# Patient Record
Sex: Female | Born: 1940 | Race: White | Hispanic: No | Marital: Single | Smoking: Never smoker
Health system: Southern US, Community
[De-identification: ages and names within clinical notes are randomized; demographics above are authoritative.]

## PROBLEM LIST (undated history)

## (undated) DIAGNOSIS — J018 Other acute sinusitis: Secondary | ICD-10-CM

## (undated) DIAGNOSIS — J3489 Other specified disorders of nose and nasal sinuses: Secondary | ICD-10-CM

## (undated) DIAGNOSIS — E7800 Pure hypercholesterolemia, unspecified: Secondary | ICD-10-CM

## (undated) DIAGNOSIS — J45909 Unspecified asthma, uncomplicated: Secondary | ICD-10-CM

## (undated) HISTORY — PX: TONSILLECTOMY: SUR1361

## (undated) HISTORY — DX: Pure hypercholesterolemia, unspecified: E78.00

## (undated) HISTORY — PX: ROTATOR CUFF REPAIR: SHX139

## (undated) HISTORY — PX: PNEUMONECTOMY: SHX168

## (undated) HISTORY — DX: Unspecified asthma, uncomplicated: J45.909

## (undated) HISTORY — PX: CORNEAL TRANSPLANT: SHX108

## (undated) HISTORY — DX: Other specified disorders of nose and nasal sinuses: J34.89

## (undated) HISTORY — DX: Other acute sinusitis: J01.80

---

## 2000-01-28 ENCOUNTER — Encounter: Payer: Self-pay | Admitting: Obstetrics and Gynecology

## 2000-01-28 ENCOUNTER — Encounter: Admission: RE | Admit: 2000-01-28 | Discharge: 2000-01-28 | Payer: Self-pay | Admitting: Obstetrics and Gynecology

## 2000-08-12 ENCOUNTER — Encounter: Admission: RE | Admit: 2000-08-12 | Discharge: 2000-08-12 | Payer: Self-pay | Admitting: Family Medicine

## 2000-08-12 ENCOUNTER — Encounter: Payer: Self-pay | Admitting: Family Medicine

## 2001-03-22 ENCOUNTER — Observation Stay (HOSPITAL_COMMUNITY): Admission: EM | Admit: 2001-03-22 | Discharge: 2001-03-23 | Payer: Self-pay | Admitting: Emergency Medicine

## 2001-03-22 ENCOUNTER — Encounter: Payer: Self-pay | Admitting: Emergency Medicine

## 2001-04-05 ENCOUNTER — Encounter: Payer: Self-pay | Admitting: Family Medicine

## 2001-04-05 ENCOUNTER — Encounter: Admission: RE | Admit: 2001-04-05 | Discharge: 2001-04-05 | Payer: Self-pay | Admitting: Family Medicine

## 2001-12-23 ENCOUNTER — Encounter: Payer: Self-pay | Admitting: Obstetrics and Gynecology

## 2001-12-23 ENCOUNTER — Encounter: Admission: RE | Admit: 2001-12-23 | Discharge: 2001-12-23 | Payer: Self-pay | Admitting: Obstetrics and Gynecology

## 2003-02-01 ENCOUNTER — Encounter: Payer: Self-pay | Admitting: Family Medicine

## 2003-02-01 ENCOUNTER — Encounter: Admission: RE | Admit: 2003-02-01 | Discharge: 2003-02-01 | Payer: Self-pay | Admitting: Family Medicine

## 2005-01-09 ENCOUNTER — Encounter: Admission: RE | Admit: 2005-01-09 | Discharge: 2005-01-09 | Payer: Self-pay | Admitting: Obstetrics and Gynecology

## 2006-01-14 ENCOUNTER — Ambulatory Visit (HOSPITAL_COMMUNITY): Admission: RE | Admit: 2006-01-14 | Discharge: 2006-01-14 | Payer: Self-pay | Admitting: Family Medicine

## 2006-02-12 ENCOUNTER — Inpatient Hospital Stay (HOSPITAL_COMMUNITY): Admission: EM | Admit: 2006-02-12 | Discharge: 2006-02-15 | Payer: Self-pay | Admitting: Emergency Medicine

## 2006-02-13 ENCOUNTER — Encounter: Payer: Self-pay | Admitting: Internal Medicine

## 2006-02-17 ENCOUNTER — Ambulatory Visit (HOSPITAL_COMMUNITY): Admission: RE | Admit: 2006-02-17 | Discharge: 2006-02-17 | Payer: Self-pay | Admitting: Cardiothoracic Surgery

## 2006-02-24 ENCOUNTER — Inpatient Hospital Stay (HOSPITAL_COMMUNITY): Admission: RE | Admit: 2006-02-24 | Discharge: 2006-03-05 | Payer: Self-pay | Admitting: Cardiothoracic Surgery

## 2006-02-24 ENCOUNTER — Encounter (INDEPENDENT_AMBULATORY_CARE_PROVIDER_SITE_OTHER): Payer: Self-pay | Admitting: *Deleted

## 2006-03-12 ENCOUNTER — Encounter: Admission: RE | Admit: 2006-03-12 | Discharge: 2006-03-12 | Payer: Self-pay | Admitting: Cardiothoracic Surgery

## 2006-04-13 ENCOUNTER — Ambulatory Visit: Payer: Self-pay | Admitting: Pulmonary Disease

## 2006-05-05 ENCOUNTER — Ambulatory Visit: Payer: Self-pay | Admitting: Pulmonary Disease

## 2006-06-11 ENCOUNTER — Ambulatory Visit: Payer: Self-pay | Admitting: Pulmonary Disease

## 2006-06-18 ENCOUNTER — Encounter: Admission: RE | Admit: 2006-06-18 | Discharge: 2006-06-18 | Payer: Self-pay | Admitting: Cardiothoracic Surgery

## 2006-09-09 ENCOUNTER — Ambulatory Visit: Payer: Self-pay | Admitting: Pulmonary Disease

## 2006-12-23 ENCOUNTER — Encounter: Admission: RE | Admit: 2006-12-23 | Discharge: 2006-12-23 | Payer: Self-pay | Admitting: Family Medicine

## 2007-01-07 ENCOUNTER — Ambulatory Visit: Payer: Self-pay | Admitting: Pulmonary Disease

## 2007-03-05 ENCOUNTER — Ambulatory Visit: Payer: Self-pay | Admitting: Pulmonary Disease

## 2007-03-10 LAB — CONVERTED CEMR LAB
BUN: 13 mg/dL (ref 6–23)
CO2: 34 meq/L — ABNORMAL HIGH (ref 19–32)
Calcium: 9.2 mg/dL (ref 8.4–10.5)
GFR calc Af Amer: 93 mL/min
GFR calc non Af Amer: 77 mL/min
Potassium: 4.3 meq/L (ref 3.5–5.1)

## 2007-03-15 ENCOUNTER — Ambulatory Visit: Payer: Self-pay | Admitting: Cardiovascular Disease

## 2007-03-24 ENCOUNTER — Ambulatory Visit (HOSPITAL_COMMUNITY): Admission: RE | Admit: 2007-03-24 | Discharge: 2007-03-24 | Payer: Self-pay | Admitting: Pulmonary Disease

## 2007-03-26 ENCOUNTER — Ambulatory Visit: Payer: Self-pay | Admitting: Pulmonary Disease

## 2007-03-31 ENCOUNTER — Ambulatory Visit (HOSPITAL_COMMUNITY): Admission: RE | Admit: 2007-03-31 | Discharge: 2007-03-31 | Payer: Self-pay | Admitting: Pulmonary Disease

## 2007-03-31 ENCOUNTER — Encounter (INDEPENDENT_AMBULATORY_CARE_PROVIDER_SITE_OTHER): Payer: Self-pay | Admitting: Pulmonary Disease

## 2007-03-31 ENCOUNTER — Ambulatory Visit: Payer: Self-pay | Admitting: Pulmonary Disease

## 2007-04-12 ENCOUNTER — Ambulatory Visit: Payer: Self-pay | Admitting: Pulmonary Disease

## 2007-05-18 ENCOUNTER — Ambulatory Visit: Payer: Self-pay | Admitting: Internal Medicine

## 2007-08-26 DIAGNOSIS — J3489 Other specified disorders of nose and nasal sinuses: Secondary | ICD-10-CM | POA: Insufficient documentation

## 2007-08-26 DIAGNOSIS — E7800 Pure hypercholesterolemia, unspecified: Secondary | ICD-10-CM | POA: Insufficient documentation

## 2007-08-26 DIAGNOSIS — J45909 Unspecified asthma, uncomplicated: Secondary | ICD-10-CM | POA: Insufficient documentation

## 2007-08-27 ENCOUNTER — Ambulatory Visit: Payer: Self-pay | Admitting: Internal Medicine

## 2008-02-02 ENCOUNTER — Encounter: Payer: Self-pay | Admitting: Internal Medicine

## 2008-02-04 ENCOUNTER — Ambulatory Visit (HOSPITAL_COMMUNITY): Admission: RE | Admit: 2008-02-04 | Discharge: 2008-02-04 | Payer: Self-pay | Admitting: Obstetrics and Gynecology

## 2008-02-25 ENCOUNTER — Ambulatory Visit: Payer: Self-pay | Admitting: Internal Medicine

## 2008-02-25 DIAGNOSIS — J310 Chronic rhinitis: Secondary | ICD-10-CM | POA: Insufficient documentation

## 2008-11-18 IMAGING — CR DG CHEST 2V
2 series · 2 of 2 positions shown · non-contrast
Comparison: 06/18/06 and 01/07/07.

CLINICAL DATA: History of prior partial lung resection.  Follow-up exam.  
CHEST - 2 VIEW:

[view not recorded (1 of 2)]
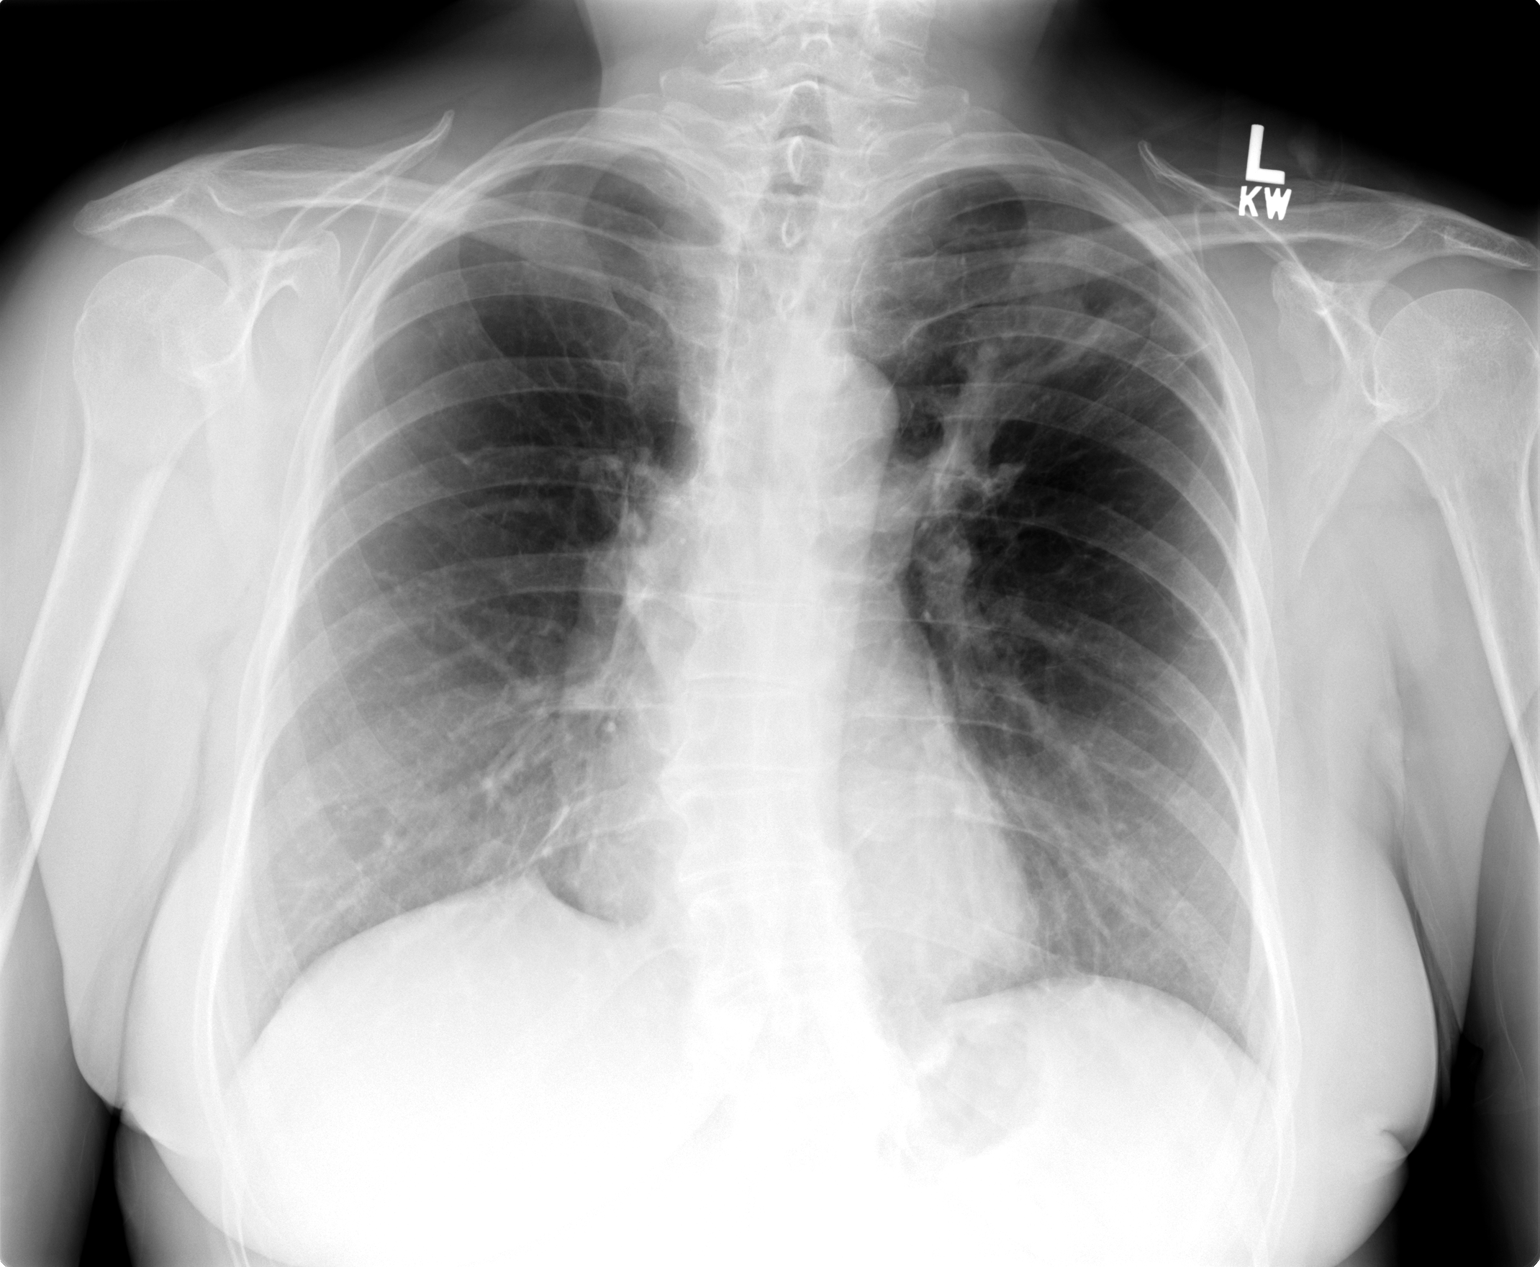

[view not recorded (2 of 2)]
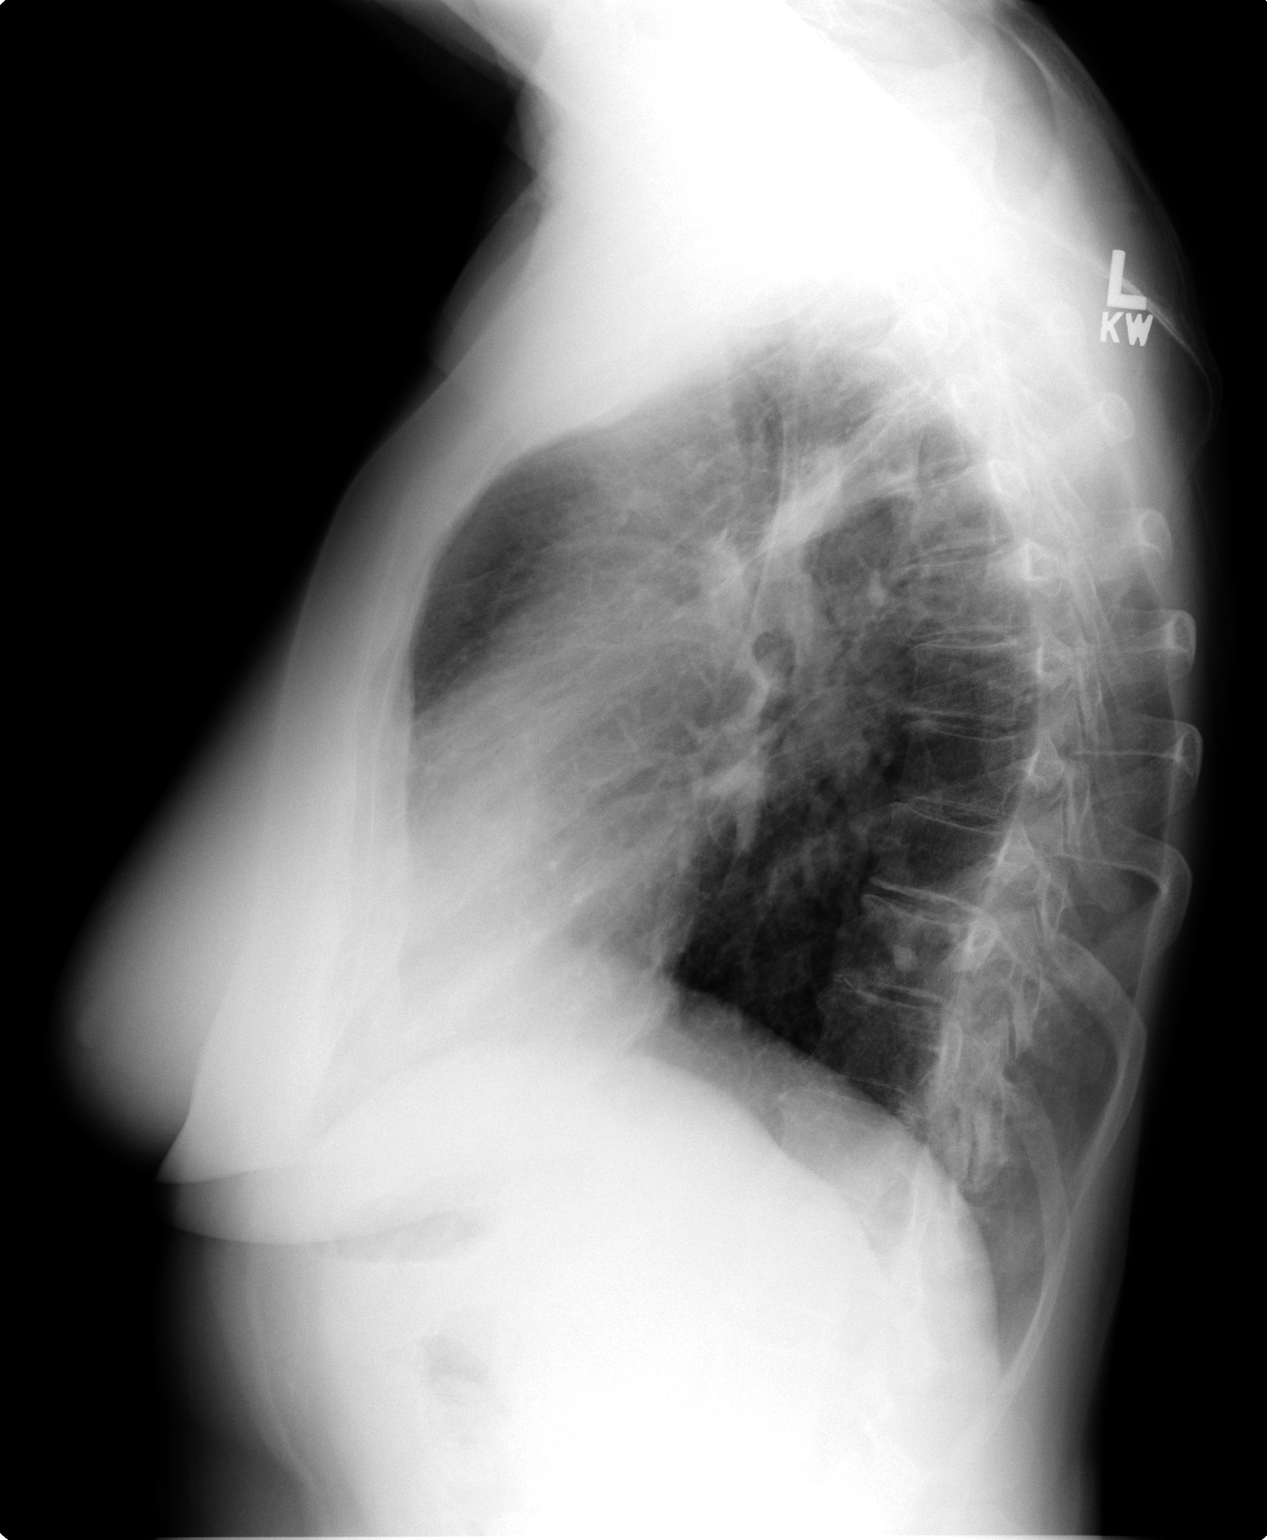

[2 of 2 positions shown; findings below may reference images not displayed]

FINDINGS: Right lung aeration is stable with minimal right lower lobe scarring.  Right hilar prominence is unchanged.  Mild scoliosis of the thoracic spine as before.  There is increased band-like opacity as well as new nodularity in the left upper lobe.  This is more pronounced compared to 01/07/07.  Because of the nodularity, follow-up CT is recommended to exclude developing left upper lobe nodules.  No large effusion, edema or pneumothorax.
IMPRESSION: 1.  Stable right lung aeration status post right upper lobectomy. 
2.  New developing nodularity in the left upper lobe along an area of band-like scarring.  Left upper lobe nodules are not excluded.  Recommend follow-up CT.

## 2009-03-19 ENCOUNTER — Encounter: Admission: RE | Admit: 2009-03-19 | Discharge: 2009-03-19 | Payer: Self-pay | Admitting: Family Medicine

## 2009-07-02 ENCOUNTER — Other Ambulatory Visit: Admission: RE | Admit: 2009-07-02 | Discharge: 2009-07-02 | Payer: Self-pay | Admitting: Family Medicine

## 2009-07-09 ENCOUNTER — Ambulatory Visit: Payer: Self-pay | Admitting: Internal Medicine

## 2009-07-20 ENCOUNTER — Ambulatory Visit: Payer: Self-pay | Admitting: Internal Medicine

## 2009-08-02 ENCOUNTER — Ambulatory Visit: Payer: Self-pay | Admitting: Internal Medicine

## 2009-08-02 DIAGNOSIS — J449 Chronic obstructive pulmonary disease, unspecified: Secondary | ICD-10-CM | POA: Insufficient documentation

## 2010-02-08 ENCOUNTER — Encounter: Admission: RE | Admit: 2010-02-08 | Discharge: 2010-02-08 | Payer: Self-pay | Admitting: Family Medicine

## 2010-05-06 ENCOUNTER — Encounter: Admission: RE | Admit: 2010-05-06 | Discharge: 2010-05-06 | Payer: Self-pay | Admitting: Allergy and Immunology

## 2010-07-08 ENCOUNTER — Ambulatory Visit (HOSPITAL_COMMUNITY): Admission: RE | Admit: 2010-07-08 | Discharge: 2010-07-08 | Payer: Self-pay | Admitting: Family Medicine

## 2010-12-01 ENCOUNTER — Encounter: Payer: Self-pay | Admitting: Cardiothoracic Surgery

## 2011-02-11 ENCOUNTER — Other Ambulatory Visit: Payer: Self-pay | Admitting: Allergy and Immunology

## 2011-02-11 ENCOUNTER — Ambulatory Visit
Admission: RE | Admit: 2011-02-11 | Discharge: 2011-02-11 | Disposition: A | Discharge status: Home / Self Care | Payer: Medicare Other | Source: Ambulatory Visit | Attending: Allergy and Immunology | Admitting: Allergy and Immunology

## 2011-02-11 DIAGNOSIS — R05 Cough: Secondary | ICD-10-CM

## 2011-03-25 NOTE — Assessment & Plan Note (Signed)
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  She also  has asthma.  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 Ofilia Neas in April 2007; this was a right upper lobectomy.  At  that time, the patient was noted to have areas of consolidation on both  upper lobes; however, on PET scan, the right upper lobe did light up;  for this reason, she underwent upper lobectomy on the 17th of April  2007.  The patient since then has developed some issues with asthma;  this is well-managed with Symbicort.  The patient presents today for  followup.  She was noted during her last visit to have perhaps some  increase on her left upper lobe known density.  CT scan did not reveal  overt change; however, a PET scan was recommended.  PET scan now shows  activity on the left upper lobe.   I had discussions with the patient and her daughters and after debating  on the best course of action, it was determined that the best issue  would be for the patient to undergo biopsy by bronchoscopy.  The patient  had the procedure described as well as potential limitations, benefits  and complications of the same and she agrees to proceed.   The procedure has been scheduled for the 21st of May.   PHYSICAL EXAMINATION:  Examination is unchanged from her prior visit  that was noted on the 25th of April.   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  The patient requires bronchoscopy.   PLAN:  Bronchoscopy on the 21st of April as previously noted.     Gailen Shelter, MD  Electronically  Signed    CLG/MedQ  DD: 03/30/2007  DT: 03/31/2007  Job #: [id number]

## 2011-03-25 NOTE — Assessment & Plan Note (Signed)
[facility]                             PULMONARY OFFICE NOTE   [name], FREEMAN                     MRN:          [id number]  DATE:04/12/2007                            DOB:          [birth year]    This is a very pleasant 70 year old female who follows up after  bronchoscopy done on Mar 31, 2007.  The patient has had issues with  bronchiolitis obliterans with organizing pneumonia, or BOOP, biopsy-  proven on lobectomy done by Dr. Ofilia Neas in April 2007.  The patient  also has bee noted to have asthma which is mild to moderate persistent  asthma.  She had been noted to have worsening of a process that was  previously noted on the left upper lobe.  This was noted at the same  time that her right upper lobe issues were noted.  She again underwent  bronchoscopy which showed no malignancy, only some benign mucosa and  some inflammation, but no organizing pneumonia.  We were to schedule her  for a fine-needle aspirate of that area given the activity noted on PET  scan.  The patient presents today for followup.  She has noted that  since her bronchoscopy she has noted somewhat more short of breath but  denies any hemoptysis or any other symptomatology.  She has had no chest  pain.   Current medications are as noted on the intake sheet.  These have been  reviewed and are accurate.  She is maintained on Symbicort for her  asthma and on Omnaris for her chronic rhinitis symptoms.   PHYSICAL EXAMINATION:  VITAL SIGNS:  As noted.  Oxygen saturation is 97%  on room air.  GENERAL:  This is a well-developed, well-nourished female who is in no  acute distress.  HEENT:  Unremarkable for age.  NECK:  Supple, no adenopathy noted, no JVD.  LUNGS:  Clear to auscultation bilaterally.  CARDIAC:  Regular rate, rhythm.  No rubs, murmurs, or gallops heard.  EXTREMITIES:  The patient has no cyanosis, no clubbing, no edema noted.   We did perform a chest x-ray  today which showed actually complete  clearing of the prior process on the left upper lobe with the exception  of some mild residual scarring.  This was actually a remarkable change  on her chest x-ray.   IMPRESSION:  I suspect the patient has recurrent bronchiolitis  obliterans with organizing pneumonia.  Her prior process on the right  did not recur,  Given that she has had right upper lobectomy.  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   PLAN:  Will be to give the patient a prednisone taper.  We will have her  follow up with Dr. Levy Pupa in a month's time with a chest x-ray at  that time.  The patient will be out of town for several weeks from here  until her appointment  with Dr. Delton Coombes.  The patient was made aware  that I am leaving the practice and she is going to continue following  with Dr. Delton Coombes as noted above. Recommend also IgE/Rast testing on FU.     Gailen Shelter, MD  Electronically Signed    CLG/MedQ  DD: 04/12/2007  DT: 04/13/2007  Job #: [id number]   cc:   Sheliah Plane, MD

## 2011-03-25 NOTE — Assessment & Plan Note (Signed)
[facility]                             PULMONARY OFFICE NOTE   [name], [name]                     MRN:          [id number]  DATE:08/27/2007                            DOB:          [birth year]    PROBLEM LIST:  1. Asthma.  2. Rhinitis.  3. Status post right upper lobectomy in 2007/BOOP.   HISTORY:  She is now on a second round of amoxicillin from Dr. Katrinka Blazing for  sinusitis.  She has tried a Neti pot once or twice.  A little cough  today only.  Otherwise had felt pretty well doing water exercises.  She  is for cornea transplant and cataract surgery in late October.  Chest x-  ray on July 8, showed further improvement in left upper lobe air space  disease.   MEDICATIONS:  Listed and reviewed.  She dropped off of Omnaris.  She has  an albuterol inhaler used p.r.n.   OBJECTIVE:  VITAL SIGNS:  Weight 164 pounds, blood pressure 114/70,  pulse 84, room air saturation 97%.  HEENT:  There is bilateral nasal mucous bridging and a little turbinate  edema.  No visible polyps.  Nothing looks purulent.  A little  periorbital edema.  Pharynx is clear.  LUNGS:  Clear.  I do not find cervical adenopathy.   IMPRESSION:  1. Clinically resolved BOOP.  2. Resolving sinusitis on amoxicillin.   PLAN:  1. She is encouraged to continue saline lavage.  2. Flu vaccine discussed and given.  3. Return in six months, anticipating we will repeat a chest x-ray in      a year or so unless indicated sooner.     Clinton D. Maple Hudson, MD, Tonny Bollman, FACP  Electronically Signed    CDY/MedQ  DD: 08/28/2007  DT: 08/29/2007  Job #: [id number]   cc:   Dario Guardian, M.D.

## 2011-03-25 NOTE — Assessment & Plan Note (Signed)
[facility]                             PULMONARY OFFICE NOTE   EDWARD, TREVINO                     MRN:          [id number]  DATE:05/18/2007                            DOB:          05/12/1941    ALLERGY CONSULTATION:   PROBLEM:  A 70 year old woman with BOOP syndrome referred for my allergy  evaluation by Dr. Marcos Eke.   HISTORY:  This is a never smoker who had an abnormal PET scan and CT  scan of the chest in May, demonstrating increasing left upper lobe and  left lower lobe masses with abnormal PET uptake in malignant range.  Transbronchial biopsy on Mar 31, 2007, demonstrated benign tissue,  nonspecific and subsequent chest x-ray as of April 12, 2007, was showing  improving left upper lobe airspace disease with incomplete resolution at  that time.  BOOP (bronchiolitis obliterans with organizing pneumonia)  was felt to be the most likely diagnosis and she was started on a slow  prednisone taper.  She says she never had considered herself to have  allergy problems until this Spring.  On 2 specific occasions, she did an  unusual amount of paperwork, digging through old files at her job, and  developed sinus congestion, itching of eyes and nose, watering of eyes,  productive cough with yellow sputum.  She feels more sensitive to  weather change and feels she is somewhat more short of breath than she  had been, especially if she bends over.  Previously she had been going  to a gymnasium regularly but she quit in the Spring when she was not  feeling well.  There has been nothing bloody, no fever, nothing frankly  purulent.  Her BOOP syndrome was treated in April with a right upper  lobectomy which confirmed the diagnosis finally.  That surgery had been  done April 2007, as I clarify the dates.  She was carried by Dr.  Jayme Cloud with diagnoses of asthma and BOOP.   MEDICATIONS:  1. Synthroid 75 mcg.  2. Vytorin 10/40.  3. Protonix 40 mg.  4.  Ambien 10 mg.  5. Aspirin 81 mg.  6. Symbicort 160/4.5 two puffs b.i.d.  7. Azopt.  8. Travatan.  9. Muro.  10.Calcium with vitamin D.  11.Albuterol inhaler, as a rescue inhaler p.r.n.  12.Pataday p.r.n.   DRUG INTOLERANCE:  PERCOCET.   PAST HISTORY:  1. Asthma.  2. Elevated cholesterol.  3. Sinus congestion.  4. Right upper lobectomy on February 24, 2006, for a diagnosis of BOOP      with residual densities in the left lung now apparently declining      on prednisone.  5. 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  6. She denies history of deep vein thrombosis, tuberculosis exposure.  7. Otherwise, has had shoulder surgery.  8. Not known to be intolerant to latex, contrast dye, or aspirin.   She is on maintenance prednisone, though I cannot find diagnosis of the  dose.  We may need to check her drugstore.   REVIEW OF SYSTEMS:  Dyspnea with activity.  Chest pains, nonspecific.  Acid  indigestion.  Nasal congestion.  No fever.  No arthralgia except  left knee needed a cortisone injection once.   SOCIAL HISTORY:  Never smoked, divorced.  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  Except for the dusty  paper exposure she describes, she has not noted increased respiratory  discomfort associated specifically with her work place or home.   FAMILY HISTORY:  Sister had blood clot.  Mother had stroke and  phlebitis.   OBJECTIVE:  VITAL SIGNS:  Weight 169 pounds, BP 108/68, pulse 81, room  air saturation 98%.  GENERAL:  Medium build.  She looks comfortable sitting quietly.  Breathing is unlabored.  SKIN:  Clear without obvious rash.  I do not find adenopathy at the  neck, shoulders, or axillae.  HEENT:  Conjunctivae are clear.  Nasal mucosa is not edematous.  Pharynx  is clear.  LUNGS:  Clear to P&A.  I  do not hear focal abnormality, dullness, or  rub.  She is not coughing.  HEART:  Sounds are regular without murmur or gallop.  ABDOMEN:  I cannot feel an enlargement of liver or spleen.  EXTREMITIES:  I do not find cyanosis, clubbing, or edema.   IMPRESSION:  1. History of bronchiolitis obliterans organizing pneumonia with right      upper lobe resection in 2007.  2. Residual left upper lobe and left lower lobe lesions also      consistent with bronchiolitis obliterans organizing pneumonia on      prednisone taper.  We need to identify her maintenance prednisone      dose and we need followup chest x-ray.  3. She describes what may have been episodes of allergic or irritant      triggered asthmatic bronchitis.  I am not sure how much of a      sustained allergy pattern she is describing now.  I would like to      let her finish up her current supply of prednisone as already      scripted out for her.  And, we will look at whether there is      meaningful need for allergy evaluation when she returns.  We have      discussed environmental precautions.   PLAN:  1. Continue Symbicort.  2. Naris nasal spray once or twice each nostril daily.  3. Chest x-ray.  4. Schedule return 3 months, earlier p.r.n.     Clinton D. Maple Hudson, MD, Tonny Bollman, FACP  Electronically Signed    CDY/MedQ  DD: 05/19/2007  DT: 05/20/2007  Job #: [id number]   cc:   Dario Guardian, M.D.

## 2011-03-25 NOTE — Op Note (Signed)
NAMELYNNDA, WIERSMA              ACCOUNT NO.:  [id number]   MEDICAL RECORD NO.:  [id number]          PATIENT TYPE:  AMB   LOCATION:  ENDO                         FACILITY:  [facility]   PHYSICIAN:  Gailen Shelter, MD  DATE OF BIRTH:  1941/08/05   DATE OF PROCEDURE:  03/31/2007  DATE OF DISCHARGE:                               OPERATIVE REPORT   PROCEDURE:  Video bronchoscopy.   This is a very pleasant 70 year old female who I have followed at  [facility] for a history of BOOP and also for asthma.  The  patient had biopsy proven BOOP after right upper lobectomy done in 2007  by Dr. Ofilia Neas.  Since then we have been observing abnormalities  noted on the left upper lobe as well.  However as of her recent follow-  up visit at [facility], it was noted that the left upper lobe lesion had  changed somewhat in character.  Also PET scan was obtained and this  lesion which previously did not show uptake showed some significant  uptake.  For this reason I felt to be prudent to go on ahead and try  bronchoscopic approach for diagnosis.  The lesion is in the left upper  lobe and this is somewhat difficult access.   For the details of the history and physical please refer to the [facility]  follow-up note dated April 25 and also the most recent note as well.   The patient was consented and admitted to the endoscopy suite where IV  access was obtained.  The patient had conscious sedation initiated with  5 mg of Versed and 50 mcg of fentanyl.  The patient had blood pressure,  heart rate, respirations and oxygen saturations monitored throughout.  She was maintained on 4 liters of oxygen throughout.   The posterior pharynx was anesthetized with Cetacaine, a bite block was  placed and the Pentax video bronchoscope was advanced via the oral  route.  Vocal cords were normal, trachea was normal.  Carina was sharp.  In the right tracheobronchial tree we noted the patient had bronchial  mucosal  thinning with occasional pitting of the bronchial mucosa.  The  right upper lobe bronchus was absent and the prior lobectomy stump  looked intact and without suspicious lesions.  The remainder of the  right tracheobronchial tree was without any endobronchial lesions.  We  then proceeded to the left tracheobronchial tree.  The same findings  were noted and the patient had thinning of the mucosa and pitting in  some areas and no endobronchial lesions were noted.  This point we did  wedge bronchoscope in the left upper lobe and attempts were made at  biopsying the left upper lobe mass.  Note is made that we could not  really get on top of the area due to the excessive bends and torque  needed to get to that area.  However, several biopsies were done under  fluoro as we appeared to be quite adjacent to the area.  Also brushings  were done as well.  These were sent for culture and cytology.  At this point bronchoalveolar lavage was done yielding 40 mL of lavage,  this after having the scope wedged in the left upper lobe.   At this point the bronchoscope was retrieved, less than 5 mL total of  heme was noted.  The patient tolerated the procedure well was taken to  the recovery area without incident.  Chest x-ray is pending.   IMPRESSION:  1. Left upper lobe process PET positive, rule out cancer.  This      however, in a patient who has had history of BOOP previously with      same exact findings.  Plan will be to await pathology.  If these      findings are negative will have to refer the patient again to Dr.      Ofilia Neas for surgical consideration.      Gailen Shelter, MD  Electronically Signed     CLG/MEDQ  D:  03/31/2007  T:  03/31/2007  Job:  [id number]   cc:   Sheliah Plane, MD

## 2011-03-28 NOTE — Assessment & Plan Note (Signed)
[facility]                             PULMONARY OFFICE NOTE   LAURYN, LIZARDI                     MRN:          [id number]  DATE:03/05/2007                            DOB:          01-06-1941    ADDENDUM:  Note is made that the patient had chest x-ray today.  This  shows persistent scarring of the left upper lobe, unchanged from  previous x-rays, and no other infiltrates noted.     Gailen Shelter, MD     CLG/MedQ  DD: 03/05/2007  DT: 03/05/2007  Job #: [id number]

## 2011-03-28 NOTE — Discharge Summary (Signed)
[name], [name]              ACCOUNT NO.:  [id number]   MEDICAL RECORD NO.:  [id number]          PATIENT TYPE:  INP   LOCATION:  2022                         FACILITY:  [facility]   PHYSICIAN:  Sheliah Plane, MD    DATE OF BIRTH:  07/03/1941   DATE OF ADMISSION:  02/24/2006  DATE OF DISCHARGE:  03/05/2006                                 DISCHARGE SUMMARY   ADMISSION DIAGNOSIS:  Right upper lobe lung mass suspicious for carcinoma.   DISCHARGE/SECONDARY DIAGNOSES:  1.  Right upper lobe lung mass, status post right upper lobectomy with      preliminary pathology indicating probably inflammatory process, although      final pathology is still pending.  2.  History of hypothyroidism.  3.  History of right  rotator cuff repair.  4.  Dyslipidemia.  5.  Osteoarthritis.  6.  History of migraines.   ALLERGIES:  NO KNOWN DRUG ALLERGIES.   PROCEDURE:  February 24, 2006, bronchoscopy, right mini thoracotomy, right  upper lobectomy with lymph node dissection by Dr. Sheliah Plane.   BRIEF HISTORY:  [name] is a 70 year old Caucasian female who presented  with vague upper and lower chest pain which lead to a CT scan of the chest  and ultimately a PET scan.  On CT scan and PET scan, she had a several  centimeter mass occupying the right upper lobe of her lung.  She also had an  area in the left lung of what was thought to be atelectasis.  A PET scan  showed an SUV of over 11 in the right upper lobe mass and approximately 2 in  the left lung.  The right lung was highly suspicious for carcinoma.  A  resection was recommended to the patient without a needle biopsy first  because of the known failure rate of needle biopsy.  It was felt that the  surgical resection lesion would be ultimately recommended if the needle  biopsy was positive for negative because of the highly suspicious lesion and  suspicious PET scan results.  She agreed to proceed.   HOSPITAL COURSE:  [name] was  electively admitted to [facility] on February 24, 2006, and underwent a right upper lobectomy.  Preliminary pathology indicated probable inflammatory mass, although final  pathology is still pending.  Postoperatively, she was initially transferred  to the surgical intensive care unit but was transferred to stepdown unit  3300 by postoperative day #1.  She was extubated and neurologically intact.  An On-Q device and morphine PCA was initiated for pain management as well as  Toradol IV.  These were ultimately weaned and pain was later controlled with  Darvocet p.o. p.r.n.  Postoperatively, she did have a critical lab value of  6.3, however, on repeat was 4.3, so did not require further treatment.  Her  posterior chest tube was discontinued on February 27, 2006.  Postoperatively,  she did have a stable 15% right apical pneumothorax , felt to be more a  space problem.  The remaining tube was discontinued on March 02, 2006, with  no  change in her chest x-ray.  In fact, there did eventually seem to be a  decrease of a pneumothorax by the time of this dictation.   On postoperative day #5, she did have a decrease in her oxygen saturation  requiring more supplemental oxygen.  She also felt more short of breath.  It  appears she also had a  history of DVT from a previous femur fracture.  A CT  angio was ordered to rule out pulmonary embolism.  This was negative for  pulmonary embolism.  Other results confirmed 15 to 20% right pneumothorax,  right postoperative changes and mild atelectasis on the right and stable  changes of COPD.  Her desaturations were felt more likely to volume excess  and she was treated with IV then oral Lasix.  The following day, she showed  improvement in her breathing but did have some scattered wheezing requiring  albuterol and Atrovent nebulizer treatments with good response and she was  eventually changed to a Combivent inhaler.   By March 04, 2006,  postoperative day #8, she was nearing readiness for  discharge.  At that time, she was on 2 liters of supplemental oxygen per  nasal cannula but saturating 98 to 100% and it was felt that this would be  weaned without difficulty.  She was breathing more comfortably.  Vital signs  were stable with blood pressure around 130/65, heart rate primarily in the  90s to 100s, although she did have temporary heart rate around 110 or so  with her albuterol nebulizer treatments.  She was also afebrile.  She was  ambulating without difficulty.  Her chest x-ray showed improving atelectasis  and decreasing right pneumothorax.  She was voiding without difficulty.  Bowels were also functioning appropriately.  Her incision also appeared to  be healing well without signs of infection.   Her most recent labs were also stable showing a sodium of 132, potassium  3.6, chloride 87, CO2 33, blood glucose 98, BUN 14, creatinine 0.7, calcium  8.9.  Total bilirubin 0.8, alkaline phosphatase 54, AST 19, ALT 17, total  protein 6.5, blood albumin 3.  Right lung cultures were negative for  bacteria and fungal and acid fast bacilli.  Preliminary cultures were also  negative.  Her white blood cell count was 8.9, hemoglobin 11.9, hematocrit  34.9, platelet count 279.   It is felt that if [name]'s breathing continues to improve and she is  weaned from supplemental oxygen that her anticipated date of discharge will  be March 05, 2006, or March 06, 2006.   DISCHARGE MEDICATIONS:  1.  Synthroid 75 mcg p.o. daily.  2.  Imipramine 50 mg p.o. nightly.  3.  Zocor 20 mg p.o. daily.  4.  Combivent inhaler two puffs inhaled q.i.d.  5.  Albuterol inhaler two puffs q.4h. p.r.n. wheezing or shortness of      breath.  6.  Darvocet N 100 one to two tablets p.o. q.4-6h. p.r.n. pain.   DISCHARGE INSTRUCTIONS:  1.  She is instructed to avoid driving or heavy lifting. 2.  She is to continue daily walking and breathing  exercises.  3.  She may shower and clean her incisions gently with soap and water but      should notify the [facility] office if she develops fever greater than 101,      increasing shortness of breath, redness or drainage from her incision      sites.  4.  She may resume a low  fat, low salt diet.  5.  She is to follow up with Sheliah Plane, MD, at the [facility] office on Mar 12, 2006, at 11:40 a.m. with a chest x-ray at [facility] one      hour before this appointment.  If final pathology results are not back      prior to discharge, she can discuss these at her follow-up appointment.      Jerold Coombe, P.A.      Sheliah Plane, MD  Electronically Signed    AWZ/MEDQ  D:  03/04/2006  T:  03/05/2006  Job:  [id number]   cc:   Dario Guardian, M.D.  Fax: [phone]   Francisca December, M.D.  Fax: [phone]   [facility]   Hollice Espy, M.D.

## 2011-03-28 NOTE — Assessment & Plan Note (Signed)
[facility]                             PULMONARY OFFICE NOTE   [name], [name]                     MRN:          [id number]  DATE:01/07/2007                            DOB:          02/28/1941    This is a very pleasant 70 year old white female, followed here for  bronchiolitis obliterans with organizing pneumonia, otherwise known as  BOOP.  This was biopsy proven.  The patient also has a history of  asthma.  She presents today stating that she has had to use her rescue  inhaler more frequently.  She had rotator cuff surgery repair on the  left.  This was done in mid November of 2007, and she has noticed the  use of rescue inhaler more since then.  She has tried to go back to an  exercise routine of treadmill and water aerobics, has not been able to  progress well with this due to dyspnea during exercise.   CURRENT MEDICATIONS:  As noted on the intake sheet.  These have been  reviewed and are accurate.   PHYSICAL EXAMINATION:  VITALS:  As noted.  Oxygen saturation is 96% on  room air.  GENERAL:  This is a well-developed, somewhat obese female who is in no  acute distress.  HEENT EXAMINATION:  Unremarkable.  NECK:  Supple.  No adenopathy noted.  No JVD.  LUNGS:  She is moving air relatively well.  I really do not detect many  wheezes, but she has a few rhonchi.  CARDIAC EXAMINATION:  Regular rate and rhythm, no murmurs, rubs, or  gallops.  EXTREMITIES:  No cyanosis, no clubbing, no edema noted.   Did obtain chest x-ray today which shows her left upper lobe chronic  changes, actually somewhat diminished from prior, and chronic  peribronchial thickening.  We also did obtain spirometry today, which  revealed an FEV1 of 1.53 or 56% of predicted, with FEV1% at 64% of  predicted.  This is markedly reduced from her prior FEV1 at 2.0 or 79%  of predicted, which was in June of 2007.   IMPRESSION:  1. Asthma, which is poorly controlled at  present, and poorly      compensated.  2. History of bronchiolitis obliterans with organizing pneumonia,      biopsy proven, with no evidence of recurrence.   PLAN:  1. Switch current Advair for Symbicort 160/4.5 two inhalations b.i.d.      with spacer.  The patient was taught proper use of the inhaler with      a spacer.  2. Followup will be in 4-6 week's time.  We will repeat spirometry at      that time, and determine whether she will need to have additional      inhalers such as Spiriva.     Gailen Shelter, MD  Electronically Signed    CLG/MedQ  DD: 01/07/2007  DT: 01/07/2007  Job #: [id number]   cc:   Dario Guardian, M.D.

## 2011-03-28 NOTE — Assessment & Plan Note (Signed)
[facility]                             PULMONARY OFFICE NOTE   KELIN, NIXON                     MRN:          [id number]  DATE:03/05/2007                            DOB:          [birth year]    This is a very pleasant 70 year old white female, whom I follow here for  bronchiolitis obliterans with organizing pneumonia, or BOOP, biopsy-  proven on lung biopsy, done by Dr. Ofilia Neas in April of 2007.  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  She presents today for followup.  She actually has been doing relatively  well until approximately three weeks ago, when she had to do a lot of  paper shredding and noticed afterwards that she had increased  difficulties with dyspnea.  This has, since then, resolved.  She feels  Symbicort is helping her, but had noted wheezing this morning.  She  has also been feeling increased tiredness for the last day.  She does  note increased difficulties with nasal congestion and sensitivity to  allergens.   CURRENT MEDICATIONS:  As noted on the intake sheet.  These have been  reviewed and are accurate.   PHYSICAL EXAM:  VITALS:  As noted.  Oxygen saturation is 97% on room  air.  IN GENERAL:  This is a well-developed, mildly overweight lady, who is in  no acute distress.  HEENT EXAMINATION:  The patient does have turbinate edema noted.  NECK:  Supple, no adenopathy noted, no JVD.  LUNGS:  Remarkably clear to auscultation.  CARDIAC EXAMINATION:  Regular rate and rhythm, no rubs, murmurs or  gallops heard.  EXTREMITIES:  Patient has no cyanosis, no clubbing, no edema noted.   We did perform spirometry today, which showed that her spirometric  numbers had completely normalized from previous spirometry, done in  February.  She had an FEV1 of 2.03 L, or 75% predicted, with an FEV1  percent of 74% predicted.  She still has some mild decrease on her mid-  flows, but again markedly  improved from prior.   IMPRESSION:  1. 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  Patient compensated.  2. Allergic rhinitis, not well-compensated on Rhinocort.  3. History of bronchiolitis obliterans with organizing pneumonia with      BOOP.   PLAN:  1. We will obtain a chest x-ray today.  2. Patient is to continue Symbicort 160/4.5 two inhalations twice a      day.  She also uses albuterol p.r.n. for rescue.  3. We will refer her to Dr. Jetty Duhamel for followup of her allergic      issues and for evaluation of allergic issues and also for followup      of her BOOP.  4. We will switch her Rhinocort to Omnaris two inhalations through      each nostril daily.  5. Patient is to contact us prior to that time, should any problems      arise.   ADDENDUM:  Note is made that the patient had chest x-ray today.  This  shows persistent scarring of the  left upper lobe, unchanged from  previous x-rays, and no other infiltrates noted.     Gailen Shelter, MD  Electronically Signed    CLG/MedQ  DD: 03/05/2007  DT: 03/05/2007  Job #: [id number]

## 2011-03-28 NOTE — Consult Note (Signed)
[name], [name]              ACCOUNT NO.:  [id number]   MEDICAL RECORD NO.:  [id number]          PATIENT TYPE:  OUT   LOCATION:  XRAY                         FACILITY:  [facility]   PHYSICIAN:  Sheliah Plane, MD    DATE OF BIRTH:  02/14/1941   DATE OF CONSULTATION:  DATE OF DISCHARGE:                                   CONSULTATION   DATE OF CONSULTATION:  February 14, 2006.   FOLLOWUP MEDICAL DOCTOR:  Dario Guardian, MD.   CARDIOLOGIST:  Francisca December, MD.   REASON FOR CONSULTATION:  Right upper lobe lung mass.   HISTORY OF PRESENT ILLNESS:  The patient is a 70 year old female, who  presented three days prior to the emergency room because of being awoken  during the night with a burning pain in the epigastric and lower chest area  radiating into the mid back bilaterally.  She noted the pain woke her up at  night, lasted about 10 minutes and resolved.  She discussed with the  insurance company triage nurse and was told to come to the [facility]  Emergency Room.  In the emergency room, a chest x-ray was done, which raised  the issue of an infiltrate in the right upper lobe.  A CT scan of the chest  was performed to rule out pulmonary embolus.  There was a focal  consolidation within the right lung apex and also in the left upper lobe  suspicious for a neoplasm.  A small left lower lobe superior segment opacity  was also noted.  A 4 mm pancreatic lesion was noted.  There was no evidence  of pulmonary emboli.  Subsequently, a PET scan was done, which showed  diffuse increased metabolic activity in the mass and the apex of the right  lung with an SUD of 11.1.  The area in the left lung demonstrated no  abnormal metabolic activity. The lesion on the thyroid gland noted on the CT  scan showed no activity.  The pancreas showed no abnormal activity.  There  was no evidence of abnormal activity in the lymph nodes of the chest.   The patient has had no hemoptysis, is a nonsmoker but  has been exposed to  second-hand smoke.  She is employed by an [employer] but has no  known exposure to asbestos but does note exposure to plastic products.  The  patient was admitted and since has been pain free.   PAST MEDICAL HISTORY:  The patient has no previous cardiac history, though  she has seen Dr. Amil Amen in the distant past and has had, she believes, two  previous stress tests but does not remember when, where, or for what reason.  Unfortunately, [facility] has not forwarded her outpatient records to  the chart.   CARDIAC RISK FACTORS:  She denies hypertension, does have known  hyperlipidemia, denies diabetes.   FAMILY HISTORY:  She does have a family history of cardiac disease.  Her  father died at age 10 of a myocardial infarction.  Mother is alive at age 11  and has had history of strokes  and TIAs.  She had one sister, who died at  age 72 with the diagnosis of ichthyosis, also had pneumonia and myocardial  infarction.  One sister is age 74.  She has one son with diabetes.  There is  a positive family history of DVT in one sister and also her mother.  Her  sister is currently on Coumadin.  She has a history of hypothyroidism,  denies renal insufficiency, denies claudication.   PAST SURGICAL HISTORY:  1.  Right shoulder surgery.  2.  In the past two years, she has had a mammogram and colonoscopy.  3.  She has no history of tuberculosis.   SOCIAL HISTORY:  The patient is employed, works at [employer] as noted  above.  Denies alcohol use and is divorced and lives alone.   MEDICATIONS:  1.  Synthroid 75 mcg.  2.  Zocor 20 mg a day.  3.  Imipramine 15 mg q.h.s.  4.  She also takes Naprosyn p.r.n. 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   DRUG ALLERGIES:  None known.   CARDIAC REVIEW OF SYSTEMS:  Positive for lower chest discomfort, which  resulted in her admission.  It is unclear if this is epigastric or chest.  She denies resting shortness of breath,  has mild exertional shortness of  breath at times, denies orthopnea, syncope, presyncope but has occasional  palpitations and has mild lower extremity edema intermittently.   GENERAL REVIEW OF SYSTEMS:  GENERAL:  Her weight has been stable.  RESPIRATORY:  She does occasionally have wheezing, no hemoptysis.  GASTROINTESTINAL:  Has a history of reflux symptoms and has been told she  has a hiatal hernia, but the documentation of this is not currently in her  chart and she does not remember how that diagnosis was made.  MUSCULOSKELETAL:  She has arthritis pain in her left knee.  Last week, Dr.  Rennis Chris gave her a cortisone shot in the left knee with improvement.  GU:  Has had frequent bladder infections in the distant past, but none recently.  PULMONARY:  Does have a history of pneumonia x2 in 1990 and 1995.  Denies  history of tuberculosis.  ENDOCRINE:  Has a history of hypothyroidism.  PSYCHIATRIC:  Denies psychiatric history.   PHYSICAL EXAMINATION:  GENERAL:  The patient is awake, alert, and  neurologically intact and able to relate her history without difficulty.  VITAL SIGNS:  Her blood pressure is 136/88, respiratory rate is 20,  temperature is 96.7, O2 SAT is 94% on two liters.  She is 5 feet 8 inches  tall and weighs 185 pounds.  HEENT:  Pupils equal, round, and reactive to light.  NECK:  Without carotid bruits.  LUNGS:  Clear bilaterally.  HEART:  I do not appreciate any murmur, mitral insufficiency, or aortic  stenosis.  ABDOMEN:  Benign without palpable masses or tenderness.  The liver is not  palpably enlarged.  She specifically has no tenderness over her gallbladder.  LOWER EXTREMITIES:  Without ischemic changes.  There is no evidence of DVT.  She has 2+ DP and PT pulses bilaterally.  LYMPH NODES:  She does not have any palpable cervical or supraclavicular  lymph nodes.  LABORATORY DATA:  White count of 7.7, hematocrit of 35.  On room air, blood  gas is 7.4, pCO2 of 46, pO2  of 71.  Troponin was less than 0.05.  EKG was  sinus rhythm with no acute changes.   RADIOGRAPHS:  The patient's CT scan,  PET scan, and chest x-ray are all  reviewed, and findings are noted above.   IMPRESSION:  1.  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  2.  Unknown epigastric/lower chest discomfort, which precipitated admission.      It is unlikely that this is related to the right upper lobe lesion,      possibly gallbladder disease versus cardiac disease, though her cardiac      enzymes were negative for infarction.   DISCUSSION:  I have discussed with the patient the high likelihood that the  right upper lobe lung lesion with an SUV of greater than 11 represents a  carcinoma of the lung.  At this point of the evaluation, it does not appear  to be metastatic.  She is likely a reasonable candidate for surgical  resection and this would be the first choice for treatment.   RECOMMENDATIONS:  1.  I have recommended to her we proceed with evaluation by Dr. Amil Amen from      Cardiology for a possible stress test.  2.  Obtain pulmonary function tests with blood gas and diffusion capacity.  3.  Ultrasound of the gallbladder to rule out incidental gallstones as the      cause for her admission.  4.  Obtain MRI of the brain to rule out metastatic disease though      symptomatically she does not have any intracranial symptoms.  Following      this evaluation, will review with her planned right video-assisted      thoracoscopy and probable right upper lobectomy depending on her      pulmonary reserve and other evaluation.  At this point, I would reserve      a needle biopsy of the right upper lobe lesion only if for some other      medical reason she becomes a non-operative candidate.  I have discussed      in detail the risk of  surgery and the diagnosis with the patient, her      daughter, and her sister.      Sheliah Plane, MD  Electronically Signed     EG/MEDQ  D:  02/14/2006  T:  02/14/2006  Job:  [id number]   cc:   Dario Guardian, M.D.  Fax: [phone]   Francisca December, M.D.  Fax: [phone]

## 2011-03-28 NOTE — Discharge Summary (Signed)
[name], [name]              ACCOUNT NO.:  [id number]   MEDICAL RECORD NO.:  [id number]          PATIENT TYPE:  INP   LOCATION:  3003                         FACILITY:  [facility]   PHYSICIAN:  Hollice Espy, M.D.DATE OF BIRTH:  17-Nov-1940   DATE OF ADMISSION:  02/12/2006  DATE OF DISCHARGE:  02/15/2006                                 DISCHARGE SUMMARY   CONSULTATIONS:  Sheliah Plane, M.D., thoracic surgery.   PRIMARY CARE PHYSICIAN:  Dario Guardian, M.D.   DISCHARGE DIAGNOSES:  1.  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  2.  Left upper lobe mass, suspect possible nonactive cancer or lesion.  3.  Midepigastric left-sided chest pain with radiation to the back of      unknown etiology, possibly musculoskeletal.  4.  History of hyperlipidemia.  5.  Hypothyroidism.  6.  Mild hypoxia noted by low partial pressure of oxygen level on arterial      blood gas, possibly related to exposure from chronic secondhand smoke.  7.  History of arthritis.   DISCHARGE MEDICATIONS:  1.  Percocet 5/325 one p.o. q.6h. p.r.n.  2.  Synthroid 75 mcg p.o. daily.  3.  Zocor 20 p.o. daily.  4.  Imipramine 50 p.o. q.h.s.  5.  It is recommended that she stop her aspirin and Naproxen prior to any      type of surgery.   HOSPITAL COURSE:  The patient is a 70 year old white female with a past  medical history of hypothyroidism and hyperlipidemia who presented to the  emergency room complaining of several episodes of a sharp burning pain in  her midepigastrium radiating underneath her left breast and into her back.  She was initially came to the emergency room for further evaluation.  Chest  x-ray, EKG, and cardiac enzymes were all unremarkable.  A CT scan was done  to rule out any form of pulmonary embolus which was negative for pulmonary  embolus; however, on CT was noted a nonspecific focal consolidation within  the right lung apex  as well as the left upper lobe suspicious for neoplasm.  A small left lower lobe superior segment opacity was also noted as well as a  4-mm nodule in the pancreatic body.  The patient was admitted for further  evaluation.  An arterial blood gas was done, and the patient was noted to  have a lower PO2 around 60, although she was asymptomatic with no shortness  of breath.  The patient was put on 2 L of oxygen and admitted for further  evaluation.  A PET scan was done on February 13, 2006 which showed malignant  level metabolic activity in the right apical lung mass occupying the  anterior aspect of the right upper lobe.  Other areas of concern mentioned  on CT scan was negative for any type of malignant activity.  CA19-9 level  had been ordered which was unremarkable and within the normal range as well.   At this point, given these findings, I did discuss this with Dr. Tyrone Sage  of  [facility] who came and evaluated the patient.  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  A further MRI of the brain was done which was  unremarkable for any form of malignancy.  Plan was once preoperative  clearance could be done specifically in the form of pulmonary function  tests, cardiology stress test, and former office records, including previous  cancer screenings, mammogram, colonoscopy, as well as pelvic exam to be  obtained and then the patient could likely then go for surgical resection.  After discussing this with the patient as well as myself, it was felt that  the patient could be stable and could go home, with returning back to the  hospital for pulmonary function tests as an outpatient as well as a follow  up with Dr. Amil Amen in his office for her stress test.  With patient with  amenable to this plan of action, she will be discharged home  with plans to  obtain pulmonary function tests and cardiology stress test in the next  several days and plans to follow up in Dr. Dennie Maizes office at the end of  the week and planned for surgery.  In the meantime, she is advised to resume  normal activity, not to return to work until postsurgical clearance.  She is  discharged on a regular diet.   In regards to the patient's episodes of pain, she continued to have one to  two episodes during her hospitalization.  Again, no episodes by EKG, and on  exam, it appeared that the pain was more in her back where she felt the  radiation through.  On exam with palpation, these areas seem tender, and  this seems to be possibly more musculoskeletal symptoms that improve with  Percocet which I am sending the patient home on as well.   DISPOSITION:  The patient's overall disposition from her initial  hospitalization is slight improvement, although she is slightly anxious  about the possibility of cancer and the impending surgery, but overall, she  is felt to be medically stable for discharge.      Hollice Espy, M.D.  Electronically Signed     SKK/MEDQ  D:  02/15/2006  T:  02/15/2006  Job:  [id number]   cc:   Sheliah Plane, MD  [address]  [place] [address]   Dario Guardian, M.D.  Fax: [phone]   [facility]   Francisca December, M.D.  Fax: [phone]

## 2011-03-28 NOTE — Assessment & Plan Note (Signed)
[facility]                                 ON-CALL NOTE   [name], [name]                       MRN:          [id number]  DATE:09/30/2006                            DOB:          July 02, 1941    I was called by the daughter of [name].  [name] underwent a  lobectomy in April with a resulting diagnosis of BOOP.  Last week, she  underwent shoulder surgery.  She has had severe pain which ineffectively  treated with Vicodin.  Her surgeon called in Dilaudid 2 mg, 1-2 p.o.  q.6h. p.r.n.  On the warning label, there was recommendation regarding  use of this medication in people have undergone lung surgery or have  respiratory difficulties.  Consequently, [name]'s daughter called  me to discuss this further.   PLAN:  I suggested that this medicine could be used with some caution.  I suggested that she try to get by taking a 2 mg dose rather than 4 mg.  I suggested that after the first dose she be watched for a couple of  hours to make sure that there are no untorrid neuropsychiatric  manifestations and no evidence of respiratory depression.     Oley Balm Sung Amabile, MD  Electronically Signed    DBS/MedQ  DD: 09/30/2006  DT: 10/01/2006  Job #: [id number]   cc:   Gailen Shelter, MD

## 2011-03-28 NOTE — H&P (Signed)
[name], [name]              ACCOUNT NO.:  [id number]   MEDICAL RECORD NO.:  [id number]          PATIENT TYPE:  EMS   LOCATION:  MAJO                         FACILITY:  [facility]   PHYSICIAN:  Hollice Espy, M.D.DATE OF BIRTH:  24-Sep-1941   DATE OF ADMISSION:  02/12/2006  DATE OF DISCHARGE:                                HISTORY & PHYSICAL   PRIMARY CARE PHYSICIAN:  Dr. Merri Brunette   CHIEF COMPLAINT:  Chest and back pain.   HISTORY OF PRESENT ILLNESS:  Patient is a 70 year old white female with past  medical history of hypothyroidism and hyperlipidemia who presented to the  emergency room after a one to two-day history of back pain, chest pain, and  increased joint pain.  Patient previously has been well.  She has had no  other complaints but after symptoms persisted she was advised to come in for  further evaluation.  In the emergency room patient had laboratory work done  including EKG and enzymes which were normal and a chest x-ray which showed  questionable areas of consolidation.  Patient had a CT scan done to better  delineate the chest x-ray as well as look for pulmonary embolus.  No signs  of pulmonary embolus were noted; however, of concern were areas of  consolidation in the posterior left upper lobe as well as the right apex  possibly suspicious for bronchial alveolar carcinoma.  In addition, a small  area of concern were noted in the pancreas as well.  On admission patient  was noted to have several slightly enlarged lymph nodes in the celiac  access.  It was felt that patient best needed admission for further  evaluation.  Currently, patient is doing well, slightly anxious from the CT  report, but otherwise denies any pain.  She currently has no complaints of  shortness of breath.  On pain medication she has no complaints of back or  chest pain.  She denies any headaches or vision changes, dysphagia, chest  pain, palpitations, shortness of breath, wheeze,  cough, abdominal pain,  hematuria, dysuria, constipation, diarrhea, focal extremity numbness,  weakness.  She has some complaints of lower extremity joint pain.  In review  of patient's history she has no episodes of increased exercise intolerance,  dyspnea on exertion, recent productive cough, hemoptysis, weight loss,  change in appetite, change in bowel habit.   PAST MEDICAL HISTORY:  1.  Hypothyroidism.  2.  Hyperlipidemia.  3.  She has had chronic exposure to secondhand smoke both from her husband,      daughter, and sister.   Patient is up to date on all medical screenings including cancer screening.  In the past two years she has had colonoscopy, mammogram, and Pap smear.   MEDICATIONS:  1.  Synthroid 75 p.o. daily.  2.  Imipramine 50 mg p.o. q.h.s.  3.  Zocor 20 p.o. q.h.s.  4.  Naproxen p.r.n. for arthritis.  5.  Hydrocodone p.r.n. for arthritis.   ALLERGIES:  Patient has no known drug allergies.   SOCIAL HISTORY:  She denies any tobacco, alcohol, or drug use.  FAMILY HISTORY:  Noted for CAD, mother with leukemia, history of DVT, CAD  with MI, and CVA.   PHYSICAL EXAMINATION:  VITAL SIGNS:  On admission temperature 98.4, heart  rate 94, blood pressure 150/84, now down to 135/64, respirations 18, O2  saturation 97% on room air.  GENERAL:  Patient is alert and oriented x3.  No apparent distress.  HEENT:  Normocephalic, atraumatic.  Her mucous membranes are moist.  She has  no carotid bruits.  HEART:  Regular rate and rhythm.  S1, S2.  LUNGS:  Clear to auscultation bilaterally.  ABDOMEN:  Soft, nontender, nondistended.  Positive bowel sounds.  EXTREMITIES:  No clubbing, cyanosis.  Trace pitting edema.   LABORATORIES:  CPK 181, MB 2.5, troponin I less than 0.05.  Sodium 136,  potassium 5, chloride 106, bicarbonate 30, BUN 19, creatinine 1, glucose  101.  pH 7.37, pCO2 53, bicarbonate again 30.  White count 7.7, H&H 12.3 and  35, MCV 89, platelet count 253.  No  shift.   ASSESSMENT/PLAN:  1.  Areas of chest CT concerning for possible carcinoma.  Will check an      arterial blood gas to evaluate for hypoxia as well as liver function      tests and a CA 19-9 level to rule out pancreatic mass.  Biggest      definitive test will be a PET scan which I have ordered and plans to be      done tomorrow at noon at [facility].  Pending these findings will then      look into discussing with CVTS and pulmonary about a sampling should      this indeed be oncologic.  The differential diagnoses include pneumonia      or possibly even scarring.  2.  Slightly elevated pCO2 level.  Patient may have some component of      chronic obstructive pulmonary disease secondary to exposure to      secondhand smoke.  3.  Hyperlipidemia.  Continue Zocor.  4.  Hypothyroidism.  Continue Synthroid.  5.  Arthritis.  Continue pain medication.      Hollice Espy, M.D.  Electronically Signed     SKK/MEDQ  D:  02/12/2006  T:  02/12/2006  Job:  [id number]   cc:   Dario Guardian, M.D.  Fax: [phone]

## 2011-03-28 NOTE — Op Note (Signed)
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   POSTOPERATIVE DIAGNOSIS:  Probable inflammatory mass, final path pending.   PROCEDURE:  Bronchoscopy, right mini-thoracotomy, right upper lobectomy,  lymph node dissection, and placement of postoperative On-Q management  system.   SURGEON:  Gwenith Daily. Tyrone Sage, M.D.   FIRST ASSISTANT:  Rowe Clack, P.A.-C.   BRIEF HISTORY:  The patient is a 70 year old female who presented with vague  upper and lower chest pain which lead to a CT scan of the chest and  ultimately a PET scan.  On CT scan and PET scan, she had a several cm mass  occupying the right upper lobe  of her lung.  She also had an area in the  left lung of what was thought to be atelectasis.  A PET scan showed an SUB  of over 11 in the right upper lobe mass and approximately 2 in the left  lung, the right lung was highly suspicious for carcinoma.  A resection was  recommended to the patient without the needle biopsy first because of the  known failure rate of needle biopsy, it was felt that the surgical resection  of the lesion would be recommended to the patient if the needle biopsy was  positive or negative because of the highly suspicious lesion and suspicious  PET scan results.  The patient agreed and signed informed consent.   DESCRIPTION OF PROCEDURE:  With a central line and arterial line in place,  the patient underwent general endotracheal anesthesia without incident.  Through a single lumen endotracheal tube, a fiberoptic bronchoscope was  passed to the subsegmental level.  There were no  endobronchial lesions  appreciated.  The scope was removed.  A blocker tube was placed and the  patient was turned in the lateral decubitus position with the right side up.  Initially, a small incision was made and entered with the video assisted  scope.  There had been a suggestion on the CT scan that the mass could  possibly be invading the mediastinum.  With the video scope, the chest was  explored and there was no evidence of invasion.  A small incision was made.  Through this incision, the lesion could be palpated.  There was a firm  lesion in the right upper lobe near the pleura but not invading the pleura  and extending into the hilar area.  Because of this, it was not felt that  wedge resection of the lesion would adequately remove it and as a wedge  resection extended into the hilum, it could potentially injure the hilar  vessels.  It was decided to proceed with lobectomy.  The fissures were  developed.  The right upper lobe pulmonary veins were identified and  encircled with a vessel loop and stapled with a vascular staple.  In a  similar fashion, the pulmonary artery branch to the right upper lobe was  also divided.  With the vessels divided, a single TA stapler was placed  across the bronchial stump and the specimen was removed.  The staple line  was inspected and was without any evidence of air leak.  A 1 cm size lymph  node was removed from the 4R regions and submitted to the pathologist.  The  right lobe was also submitted.  On discussion with the pathologist, the  initial frozen section was thought to be inflammatory tissue, however, the  suspicion was that this could potentially be a bronchoalveolar and further  studies of the specimen will be carried out to insure there is no occult  metastatic disease in the right upper lobe.  Two chest tubes were left in  place, a single stitch was placed to reapproximate the ribs.  A single On-Q  device was placed in the subcutaneous  tissue.  The incision was then closed  with interrupted 0 Vicryl taking care not to trap the On-Q device.  Interrupted 3-0 Vicryls in the skin edges and a 4-0 subcuticular skin  stitch.  Dry dressing was applied.  Sponge and needle counts was reported as  correct at the completion of the procedure.  The patient tolerated the  procedure well without obvious complications, was extubated and transferred  to the recovery room for postoperative observation.  Estimated blood loss  approximately 150 mL.      Sheliah Plane, MD  Electronically Signed     EG/MEDQ  D:  02/24/2006  T:  02/24/2006  Job:  [id number]

## 2011-03-28 NOTE — Assessment & Plan Note (Signed)
[facility]                               PULMONARY OFFICE NOTE   [name], [name]                     MRN:          [id number]  DATE:06/11/2006                            DOB:          [birth year]    This is a very pleasant 70 year old female who I have been following here  for bronchiolitis obliterans with organizing pneumonia, biopsy proven.  The  patient has just finished therapy with prednisone.  She has had no  difficulty since discontinuing the prednisone.  She does have some issues  with mild asthmatic bronchitis, and she is being treated with Advair, which  she is compliant with.  She denies any fevers, chills, or sweats.  She has  had no cough.  No sputum production.  No hemoptysis.   CURRENT MEDICATIONS:  As noted on the intake sheet.  These have been  reviewed and are accurate.   PHYSICAL EXAMINATION:  VITAL SIGNS:  As noted.  Oxygen saturation is 94% on  room air.  GENERAL:  This is a well-developed and well-nourished female who is in no  acute distress.  HEENT:  Unremarkable.  NECK:  Supple.  No adenopathy noted.  No JVD.  LUNGS:  Clear to auscultation bilaterally.  CARDIAC:  Regular rate and rhythm.  No murmurs, rubs or gallops.  EXTREMITIES:  No clubbing, cyanosis or edema noted.   We did perform chest x-ray today, which showed no new lesions or increase on  her scarring and chronic changes.   IMPRESSION:  1. Bronchiolitis-obliterans with organizing pneumonia (BOOP):  The patient      is very well compensated and with no evidence of flare since treatment      with steroids.  2. Asthmatic bronchitis, well compensated.   PLAN:  1. Patient is to continue Advair as is.  2. Followup will be in three months time with a chest x-ray at that time.      She is to contact us prior to that time should any new problems arise.                                   Gailen Shelter, MD   CLG/MedQ  DD:  06/30/2006  DT:   06/30/2006  Job #:  [id number]   cc:   Sheliah Plane, MD

## 2011-04-16 ENCOUNTER — Other Ambulatory Visit (INDEPENDENT_AMBULATORY_CARE_PROVIDER_SITE_OTHER): Payer: Medicare Other

## 2011-04-16 ENCOUNTER — Ambulatory Visit (INDEPENDENT_AMBULATORY_CARE_PROVIDER_SITE_OTHER)
Admission: RE | Admit: 2011-04-16 | Discharge: 2011-04-16 | Disposition: A | Discharge status: Home / Self Care | Payer: Medicare Other | Source: Ambulatory Visit | Attending: Internal Medicine | Admitting: Internal Medicine

## 2011-04-16 ENCOUNTER — Ambulatory Visit (INDEPENDENT_AMBULATORY_CARE_PROVIDER_SITE_OTHER): Payer: Medicare Other | Admitting: Internal Medicine

## 2011-04-16 ENCOUNTER — Encounter: Payer: Self-pay | Admitting: Internal Medicine

## 2011-04-16 VITALS — BP 124/70 | HR 80 | Ht 67.0 in | Wt 161.8 lb

## 2011-04-16 DIAGNOSIS — J449 Chronic obstructive pulmonary disease, unspecified: Secondary | ICD-10-CM

## 2011-04-16 DIAGNOSIS — J4489 Other specified chronic obstructive pulmonary disease: Secondary | ICD-10-CM

## 2011-04-16 LAB — CBC WITH DIFFERENTIAL/PLATELET
Basophils Relative: 0.8 % (ref 0.0–3.0)
Eosinophils Absolute: 0 10*3/uL (ref 0.0–0.7)
Eosinophils Relative: 0.7 % (ref 0.0–5.0)
Hemoglobin: 12.6 g/dL (ref 12.0–15.0)
Lymphocytes Relative: 23 % (ref 12.0–46.0)
Monocytes Relative: 11.8 % (ref 3.0–12.0)
Neutro Abs: 4.1 10*3/uL (ref 1.4–7.7)
Neutrophils Relative %: 63.7 % (ref 43.0–77.0)
RBC: 4.13 Mil/uL (ref 3.87–5.11)
WBC: 6.4 10*3/uL (ref 4.5–10.5)

## 2011-04-16 NOTE — Patient Instructions (Signed)
CXR- dx COPD  CBC w/ diff

## 2011-04-16 NOTE — Progress Notes (Signed)
  Subjective:    Patient ID: [name], female    DOB: [birth year], 70 y.o.   MRN: [id number]  HPI 04/16/11- 55 yoF never smoker followed for dx of COPD and hx BOOP/ COP, rhinosinusitis Last here 07/13/09 - note reviewed. Last winter did have lingering chest cold.  Since here she got chilled in rain at a [employer], then went on vacation to [place] where she got nauseated, had cough, and was dxd with pneumonia at an urgent care on Mar 12, 2011 w/ cxr. Treated levaquin and prednisone.  Now home 1 month. Still a little easier DOE walking only 2 blocks instead of her usual daily mile. Feels weak and SOB. Notes she has felt uncomfortable early satiety as unpleasant fullness in upper epigstrium. Hx of reflux on RX. No weight loss  Review of Systems   Constitutional:   No weight loss, night sweats,  Fevers, chills, fatigue, lassitude. HEENT:   No headaches,  Difficulty swallowing,  Tooth/dental problems,  Sore throat,                No sneezing, itching, ear ache, nasal congestion, post nasal drip,   CV:  No chest pain,  Orthopnea, PND, swelling in lower extremities, anasarca, dizziness, palpitations  GI  No heartburn, indigestion, abdominal pain, nausea, vomiting, diarrhea, change in bowel habits,  Resp:  No excess mucus, no productive cough,  No non-productive cough,  No coughing up of blood.  No change in color of mucus.  No wheezing.   Skin: no rash or lesions.  GU: no dysuria, change in color of urine, no urgency or frequency.  No flank pain.  MS:  No joint pain or swelling.  No decreased range of motion.  No back pain.  Psych:  No change in mood or affect. No depression or anxiety.  No memory loss.   Objective:   Physical Exam General- Alert, Oriented, Affect-appropriate, Distress- none acute    Looks well  Skin- rash-none, lesions- none, excoriation- none  Lymphadenopathy- none  Head- atraumatic  Eyes- Gross vision intact, PERRLA, conjunctivae clear secretions  Ears-  Hearing, canals, Tm - normal  Nose- Clear, no- Septal dev, mucus, polyps, erosion, perforation   Throat- Mallampati II , mucosa clear , drainage- none, tonsils- atrophic  Neck- flexible , trachea midline, no stridor , thyroid nl, carotid no bruit  Chest - symmetrical excursion , unlabored     Heart/CV- RRR , no murmur , no gallop  , no rub, nl s1 s2                     - JVD- none , edema- none, stasis changes- none, varices- none     Lung- clear to P&A, wheeze- none, cough- none , dullness-none, rub- none     Chest wall- Abd- tender-no, distended-no, bowel sounds-present, HSM- no  Br/ Gen/ Rectal- Not done, not indicated  Extrem- cyanosis- none, clubbing, none, atrophy- none, strength- nl  Neuro- grossly intact to observation         Assessment & Plan:

## 2011-04-18 NOTE — Progress Notes (Signed)
Quick Note:  Pt aware of results. ______ 

## 2011-07-22 ENCOUNTER — Other Ambulatory Visit (HOSPITAL_COMMUNITY): Payer: Self-pay | Admitting: Family Medicine

## 2011-07-22 DIAGNOSIS — Z1231 Encounter for screening mammogram for malignant neoplasm of breast: Secondary | ICD-10-CM

## 2011-07-31 ENCOUNTER — Ambulatory Visit (HOSPITAL_COMMUNITY)
Admission: RE | Admit: 2011-07-31 | Discharge: 2011-07-31 | Disposition: A | Discharge status: Home / Self Care | Payer: Medicare Other | Source: Ambulatory Visit | Attending: Family Medicine | Admitting: Family Medicine

## 2011-07-31 DIAGNOSIS — Z1231 Encounter for screening mammogram for malignant neoplasm of breast: Secondary | ICD-10-CM | POA: Insufficient documentation

## 2011-08-13 DIAGNOSIS — R3911 Hesitancy of micturition: Secondary | ICD-10-CM | POA: Insufficient documentation

## 2011-08-13 DIAGNOSIS — R3915 Urgency of urination: Secondary | ICD-10-CM | POA: Insufficient documentation

## 2012-03-03 DIAGNOSIS — H18519 Endothelial corneal dystrophy, unspecified eye: Secondary | ICD-10-CM | POA: Insufficient documentation

## 2012-05-26 DIAGNOSIS — Z961 Presence of intraocular lens: Secondary | ICD-10-CM | POA: Insufficient documentation

## 2012-05-26 DIAGNOSIS — Z947 Corneal transplant status: Secondary | ICD-10-CM | POA: Insufficient documentation

## 2012-10-04 ENCOUNTER — Encounter: Payer: Self-pay | Admitting: Internal Medicine

## 2012-10-04 ENCOUNTER — Ambulatory Visit (INDEPENDENT_AMBULATORY_CARE_PROVIDER_SITE_OTHER): Payer: Medicare Other | Admitting: Internal Medicine

## 2012-10-04 VITALS — BP 122/76 | HR 77 | Ht 67.0 in | Wt 171.0 lb

## 2012-10-04 DIAGNOSIS — J449 Chronic obstructive pulmonary disease, unspecified: Secondary | ICD-10-CM

## 2012-10-04 NOTE — Progress Notes (Signed)
Subjective:    Patient ID: [name], female    DOB: August 10, 1941, 71 y.o.   MRN: [id number]  HPI 04/16/11- 71 yoF never smoker followed for dx of COPD and hx BOOP/ COP, rhinosinusitis Last here 07/13/09 - note reviewed. Last winter did have lingering chest cold.  Since here she got chilled in rain at a [employer], then went on vacation to [place] where she got nauseated, had cough, and was dxd with pneumonia at an urgent care on Mar 12, 2011 w/ cxr. Treated levaquin and prednisone.  Now home 1 month. Still a little easier DOE walking only 2 blocks instead of her usual daily mile. Feels weak and SOB. Notes she has felt uncomfortable early satiety as unpleasant fullness in upper epigstrium. Hx of reflux on RX. No weight loss  10/04/12- 71 yoF never smoker followed for dx of COPD and hx BOOP/ COP/ RUL resection, rhinosinusitis FOLLOWS FOR: breathing is unchanged since last OV. Reports slight chest tightness. Denies chest pain, increased SOB or cough. She grew up with father smoking cigars, ex-husband was heavy smoker and lately caring for sister who smokes. Probably significant second-hand smoke over years. Always feels a little tight, occ productive cough or wheeze. No night sweat or fever. No regular exercise. Mainly DOE. Uses Ventolin HFA few x/ week, daily Symbicort.  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  CXR 04/18/11- images reviewed w/ her noting hyperinflation on lateral view IMPRESSION:  Hyperinflation and bilateral scarring. No acute findings.  Original Report Authenticated By: Consuello Bossier, M.D.   ROS-see HPI Constitutional:   No-   weight loss, night sweats, fevers, chills, fatigue, lassitude. HEENT:   No-  headaches, difficulty swallowing, tooth/dental problems, sore throat,       No-  sneezing, itching, ear ache, nasal congestion, post nasal drip,  CV:  No-   chest pain, orthopnea, PND, swelling in lower extremities, anasarca, dizziness, palpitations Resp: + shortness of breath  with exertion or at rest.              + productive cough,  No non-productive cough,  No- coughing up of blood.              No-   change in color of mucus.  No- wheezing.   Skin: No-   rash or lesions. GI:  No-   heartburn, indigestion, abdominal pain, nausea, vomiting, GU:  MS:  No-   joint pain or swelling.  Neuro-     nothing unusual Psych:  No- change in mood or affect. No depression or anxiety.  No memory loss. Objective:  OBJ- Physical Exam General- Alert, Oriented, Affect-appropriate, Distress- none acute Skin- rash-none, lesions- none, excoriation- none Lymphadenopathy- none Head- atraumatic            Eyes- Gross vision intact, PERRLA, conjunctivae and secretions clear            Ears- Hearing, canals-normal            Nose- Clear, no-Septal dev, mucus, polyps, erosion, perforation             Throat- Mallampati II , mucosa clear , drainage- none, tonsils- atrophic Neck- flexible , trachea midline, no stridor , thyroid nl, carotid no bruit Chest - symmetrical excursion , unlabored           Heart/CV- RRR , no murmur , no gallop  , no rub, nl s1 s2                           -  JVD- none , edema- none, stasis changes- none, varices- none           Lung- clear to P&A, wheeze- none, cough- none , dullness-none, rub- none           Chest wall-  Abd-  Br/ Gen/ Rectal- Not done, not indicated Extrem- cyanosis- none, clubbing, none, atrophy- none, strength- nl Neuro- grossly intact to observation  Assessment & Plan:

## 2012-10-04 NOTE — Patient Instructions (Addendum)
Order- schedule 6 MWT, PFT

## 2012-10-04 NOTE — Assessment & Plan Note (Signed)
Probably mostly COPD rather than reactive airways disease Plan- PFT, 6 MWT looking for treatable reversibility. Encouraged walking. Consider Pulmonary Rehab.

## 2012-10-12 ENCOUNTER — Other Ambulatory Visit: Payer: Self-pay | Admitting: Family Medicine

## 2012-10-12 DIAGNOSIS — R51 Headache: Secondary | ICD-10-CM

## 2012-10-13 ENCOUNTER — Ambulatory Visit
Admission: RE | Admit: 2012-10-13 | Discharge: 2012-10-13 | Disposition: A | Discharge status: Home / Self Care | Payer: Medicare Other | Source: Ambulatory Visit | Attending: Family Medicine | Admitting: Family Medicine

## 2012-10-13 DIAGNOSIS — R51 Headache: Secondary | ICD-10-CM

## 2012-10-13 MED ORDER — IOHEXOL 300 MG/ML  SOLN
100.0000 mL | Freq: Once | INTRAMUSCULAR | Status: AC | PRN
  Administered 2012-10-13: 100 mL via INTRAVENOUS

## 2012-11-24 ENCOUNTER — Ambulatory Visit: Payer: Medicare Other

## 2012-11-24 ENCOUNTER — Ambulatory Visit: Payer: Medicare Other | Admitting: Internal Medicine

## 2012-12-17 ENCOUNTER — Telehealth: Payer: Self-pay | Admitting: Internal Medicine

## 2012-12-17 NOTE — Telephone Encounter (Signed)
Called, spoke with pt's daughter, [name].  States pt was supposed to see Dr. Delton Coombes when Dr. Jayme Cloud left but instead she was placed on Dr. Roxy Cedar schedule.  States the last time she was seen by CDY, he said she has COPD.  [name] states pt doesn't have COPD but does have BOOP.  States pt's breathing has been an issue with the cold and damp weather.  Pt only wants to see Dr. Delton Coombes for this and will go to [facility] or elsewhere is she cannot see RB.  They would like to switch from CDY to RB.  [name] is aware of the protocol for switching doctors.  Dr. Maple Hudson, are you ok with this?  Pls advise.  Thank you.

## 2012-12-17 NOTE — Telephone Encounter (Signed)
Sure, that's fine to switch to Dr Delton Coombes

## 2012-12-17 NOTE — Telephone Encounter (Signed)
Dr. Cristy Hilts advise if you will see the pt.

## 2012-12-27 NOTE — Telephone Encounter (Signed)
[name] (daughter) called back to see if the switch to RB is ok and requesting pt be seen this week if possible due to being SOB.  Please contact daughter @ [phone]. [name]

## 2012-12-27 NOTE — Telephone Encounter (Signed)
Spoke with [name] and advised still awaiting an answer from RB on whether ok to take this pt on She verbalized understanding I have scheduled the pt with TP for tomorrow since she is having some SOB  RB, please advise, thanks!

## 2012-12-28 ENCOUNTER — Encounter: Payer: Self-pay | Admitting: Adult Health

## 2012-12-28 ENCOUNTER — Ambulatory Visit (INDEPENDENT_AMBULATORY_CARE_PROVIDER_SITE_OTHER): Payer: Medicare Other | Admitting: Adult Health

## 2012-12-28 ENCOUNTER — Ambulatory Visit (INDEPENDENT_AMBULATORY_CARE_PROVIDER_SITE_OTHER)
Admission: RE | Admit: 2012-12-28 | Discharge: 2012-12-28 | Disposition: A | Discharge status: Home / Self Care | Payer: Medicare Other | Source: Ambulatory Visit | Attending: Adult Health | Admitting: Adult Health

## 2012-12-28 VITALS — BP 116/72 | HR 85 | Temp 98.2°F | Ht 67.0 in | Wt 168.0 lb

## 2012-12-28 DIAGNOSIS — J449 Chronic obstructive pulmonary disease, unspecified: Secondary | ICD-10-CM

## 2012-12-28 DIAGNOSIS — J45909 Unspecified asthma, uncomplicated: Secondary | ICD-10-CM

## 2012-12-28 MED ORDER — PREDNISONE 10 MG PO TABS: ORAL_TABLET | ORAL | Status: DC

## 2012-12-28 NOTE — Telephone Encounter (Signed)
Dr Byrum please advise, thanks. 

## 2012-12-28 NOTE — Assessment & Plan Note (Addendum)
Suspect Asthma flare  cxr w/ no acute process  Spirometry w/ preserved lung fxn  w/ no significant changes from 2010 .  Did have slight drop in O2 sat briefly w/ walking w/ quick rebound  2010 PFT w/ nml DLCO  Will need repeat on walking sat on return   Plan  Prednisone taper over next week.  Continue on Symbicort 2 puffs Twice daily   follow up Dr. Delton Coombes  2 weeks and As needed   Please contact office for sooner follow up if symptoms do not improve or worsen or seek emergency care

## 2012-12-28 NOTE — Progress Notes (Signed)
Subjective:    Patient ID: [name], female    DOB: [birth year], 72 y.o.   MRN: [id number]  HPI 04/16/11- 40 yoF never smoker followed for dx of COPD and hx BOOP/ COP, rhinosinusitis Last here 07/13/09 - note reviewed. Last winter did have lingering chest cold.  Since here she got chilled in rain at a [employer], then went on vacation to [place] where she got nauseated, had cough, and was dxd with pneumonia at an urgent care on Mar 12, 2011 w/ cxr. Treated levaquin and prednisone.  Now home 1 month. Still a little easier DOE walking only 2 blocks instead of her usual daily mile. Feels weak and SOB. Notes she has felt uncomfortable early satiety as unpleasant fullness in upper epigstrium. Hx of reflux on RX. 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 Reports slight chest tightness. Denies chest pain, increased SOB or cough. She grew up with father smoking cigars, ex-husband was heavy smoker and lately caring for sister who smokes. Probably significant second-hand smoke over years. Always feels a little tight, occ productive cough or wheeze. No night sweat or fever. No regular exercise. Mainly DOE. Uses Ventolin HFA few x/ week, daily Symbicort.  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  CXR 04/18/11- images reviewed w/ her noting hyperinflation on lateral view IMPRESSION:  Hyperinflation and bilateral scarring. No acute findings.  Original Report Authenticated By: [name], M.D.    12/28/2012 Acute OV  Hx of S/P partial RUL lobectomy for BOOP 2007. ?Asthma vs RAD  Complains of increased SOB, DOE, tightness in chest, dry cough x2 months, worse x2 weeks.  denies f/c/s, wheezing, chest congestion, head congestion, PND. CXR today shows chronic changes- no acute process.  Spirometry shows 1.85L 76%/(ratio-68)  Ambulatory walk w/ brief desat at 87 % w/ quick rebound to  95% .  Remains on symbicort- no missed doses No fever or discolored mucus .  No chest pain or edema. No calf pain. No  Injury  No recent travel or prolonged immobility.     ROS-see HPI Constitutional:   No  weight loss, night sweats,  Fevers, chills,  +fatigue, or  lassitude.  HEENT:   No headaches,  Difficulty swallowing,  Tooth/dental problems, or  Sore throat,                No sneezing, itching, ear ache,  +nasal congestion, post nasal drip,   CV:  No chest pain,  Orthopnea, PND, swelling in lower extremities, anasarca, dizziness, palpitations, syncope.   GI  No heartburn, indigestion, abdominal pain, nausea, vomiting, diarrhea, change in bowel habits, loss of appetite, bloody stools.   Resp:  No coughing up of blood.  No change in color of mucus.  No wheezing.  No chest wall deformity  Skin: no rash or lesions.  GU: no dysuria, change in color of urine, no urgency or frequency.  No flank pain, no hematuria   MS:  No joint pain or swelling.  No decreased range of motion.  No back pain.  Psych:  No change in mood or affect. No depression or anxiety.  No memory loss.       Objective:  OBJ- Physical Exam GEN: A/Ox3; pleasant , NAD  HEENT:  [place]/AT,  EACs-clear, TMs-wnl, NOSE-clear, THROAT-clear, no lesions, no postnasal drip or exudate noted.   NECK:  Supple w/ fair  ROM; no JVD; normal carotid impulses w/o bruits; no thyromegaly or nodules palpated; no lymphadenopathy.  RESP  Clear  P & A; w/o, wheezes/ rales/ or rhonchi.no accessory muscle use, no dullness to percussion  CARD:  RRR, no m/r/g  , no peripheral edema, pulses intact, no cyanosis or clubbing.  GI:   Soft & nt; nml bowel sounds; no organomegaly or masses detected.  Musco: Warm bil, no deformities or joint swelling noted.   Neuro: alert, no focal deficits noted.    Skin: Warm, no lesions or rashes    Assessment & Plan:

## 2012-12-28 NOTE — Patient Instructions (Addendum)
Prednisone taper over next week.  Continue on Symbicort 2 puffs Twice daily   follow up Dr. Delton Coombes  2 weeks and As needed   Please contact office for sooner follow up if symptoms do not improve or worsen or seek emergency care

## 2012-12-29 NOTE — Telephone Encounter (Signed)
i want to speak to Dr Maple Hudson about this, and have not had the opportunity to do so yet. That is why I have not responded. I will speak to him either late this week or beginning of next week.

## 2012-12-29 NOTE — Telephone Encounter (Signed)
Noted by triage; will send to CY to let him know.

## 2013-01-24 ENCOUNTER — Ambulatory Visit: Payer: Medicare Other | Admitting: Emergency Medicine

## 2013-02-10 ENCOUNTER — Encounter: Payer: Self-pay | Admitting: Emergency Medicine

## 2013-02-10 ENCOUNTER — Ambulatory Visit (INDEPENDENT_AMBULATORY_CARE_PROVIDER_SITE_OTHER): Payer: Medicare Other | Admitting: Emergency Medicine

## 2013-02-10 VITALS — BP 140/70 | HR 84 | Temp 98.3°F | Ht 67.0 in | Wt 169.0 lb

## 2013-02-10 DIAGNOSIS — J45909 Unspecified asthma, uncomplicated: Secondary | ICD-10-CM

## 2013-02-10 DIAGNOSIS — J018 Other acute sinusitis: Secondary | ICD-10-CM

## 2013-02-10 NOTE — Assessment & Plan Note (Signed)
Same regimen.

## 2013-02-10 NOTE — Progress Notes (Signed)
Subjective:    Patient ID: [name], female    DOB: 1941-07-01, 72 y.o.   MRN: [id number]  HPI 04/16/11- 72 yoF never smoker followed for dx of COPD and hx BOOP/ COP, rhinosinusitis Last here 07/13/09 - note reviewed. Last winter did have lingering chest cold.  Since here she got chilled in rain at a [employer], then went on vacation to [place] where she got nauseated, had cough, and was dxd with pneumonia at an urgent care on Mar 12, 2011 w/ cxr. Treated levaquin and prednisone.  Now home 1 month. Still a little easier DOE walking only 2 blocks instead of her usual daily mile. Feels weak and SOB. Notes she has felt uncomfortable early satiety as unpleasant fullness in upper epigstrium. Hx of reflux on RX. 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 Reports slight chest tightness. Denies chest pain, increased SOB or cough. She grew up with father smoking cigars, ex-husband was heavy smoker and lately caring for sister who smokes. Probably significant second-hand smoke over years. Always feels a little tight, occ productive cough or wheeze. No night sweat or fever. No regular exercise. Mainly DOE. Uses Ventolin HFA few x/ week, daily Symbicort.  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  CXR 04/18/11- images reviewed w/ her noting hyperinflation on lateral view IMPRESSION:  Hyperinflation and bilateral scarring. No acute findings.  Original Report Authenticated By: Consuello Bossier, M.D.    Acute OV 12/28/12 --  Hx of S/P partial RUL lobectomy for BOOP 2007. ?Asthma vs RAD  Complains of increased SOB, DOE, tightness in chest, dry cough x2 months, worse x2 weeks.  denies f/c/s, wheezing, chest congestion, head congestion, PND. CXR today shows chronic changes- no acute process.  Spirometry shows 1.85L 76%/(ratio-68)  Ambulatory walk w/ brief desat at 87 % w/ quick rebound  to 95% .  Remains on symbicort- no missed doses No fever or discolored mucus .  No chest pain or edema. No calf pain. No  Injury  No recent travel or prolonged immobility.    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  She was last seen here Feb '14 for dyspnea, cough, CP that she first noticed in January climbing stairs. She was treated with pred taper, continued on Symbicort. Returns now to report that her breathing is better, chest discomfort is better but still happens sometimes w exertion. Her cough is resolved. She uses albuterol a few times a month.  She is on nasonex, astepro prn.    Past Medical History  Diagnosis Date  . Other acute sinusitis   . Other diseases of nasal cavity and sinuses   . Pure hypercholesterolemia   . Unspecified asthma      Family History  Problem Relation Age of Onset  . Heart attack Father      History   Social History  . Marital Status: Single    Spouse Name: N/A    Number of Children: N/A  . Years of Education: N/A   Occupational History  . Not on file.   Social History Main Topics  . Smoking status: Passive Smoke Exposure - Never Smoker  . Smokeless tobacco: Never Used  .  Alcohol Use: Not on file  . Drug Use: Not on file  . Sexually Active: Not on file   Other Topics Concern  . Not on file   Social History Narrative  . No narrative on file     Allergies  Allergen Reactions  . Oxycodone-Acetaminophen     INTOLERANT TO PERCOCET - flushing, redness  . Travoprost     INTOLERANT TO TRAVATAN - eyes red and burning     Outpatient Prescriptions Prior to Visit  Medication Sig Dispense Refill  . albuterol (PROAIR HFA) 108 (90 BASE) MCG/ACT inhaler Inhale 2 puffs into the lungs 4 (four) times daily as needed.        Marland Kitchen aspirin 81 MG tablet Take 81 mg by mouth daily.        Marland Kitchen  atorvastatin (LIPITOR) 10 MG tablet Take 10 mg by mouth daily.      Marland Kitchen azelastine (ASTEPRO) 137 MCG/SPRAY nasal spray Place 1 spray into the nose 2 (two) times daily as needed.       . budesonide-formoterol (SYMBICORT) 160-4.5 MCG/ACT inhaler Inhale 2 puffs into the lungs 2 (two) times daily.        . calcium-vitamin D (OSCAL) 250-125 MG-UNIT per tablet Take 1 tablet by mouth daily.        . Grape Seed OIL Take 1 capsule by mouth daily.       Marland Kitchen levothyroxine (SYNTHROID, LEVOTHROID) 75 MCG tablet Take 50 mcg by mouth daily.       . mometasone (NASONEX) 50 MCG/ACT nasal spray Place 2 sprays into the nose daily.        . Omega-3 Fatty Acids (FISH OIL) 1000 MG CAPS Take 1 capsule by mouth daily.        . pantoprazole (PROTONIX) 40 MG tablet Take 40 mg by mouth daily.        . Vitamin D, Cholecalciferol, 400 UNITS CHEW Chew 1 tablet by mouth daily.       Marland Kitchen zolpidem (AMBIEN) 10 MG tablet Take 10 mg by mouth at bedtime as needed.        . brimonidine (ALPHAGAN P) 0.1 % SOLN Place 1 drop into both eyes 2 (two) times daily.       . predniSONE (DELTASONE) 10 MG tablet 4 tabs for 2 days, then 3 tabs for 2 days, 2 tabs for 2 days, then 1 tab for 2 days, then stop  20 tablet  0   No facility-administered medications prior to visit.       Objective:  OBJ- Physical Exam Filed Vitals:   02/10/13 1508  BP: 140/70  Pulse: 84  Temp: 98.3 F (36.8 C)   GEN: A/Ox3; pleasant , NAD  HEENT:  [place]/AT,  EACs-clear, TMs-wnl, NOSE-clear, THROAT-clear, no lesions, no postnasal drip or exudate noted.   NECK:  Supple w/ fair ROM; no JVD; normal carotid impulses w/o bruits; no thyromegaly or nodules palpated; no lymphadenopathy.  RESP  Clear  P & A; w/o, wheezes/ rales/ or rhonchi.no accessory muscle use, no dullness to percussion  CARD:  RRR, no m/r/g  , no peripheral edema, pulses intact, no cyanosis or clubbing.  Musco: Warm bil, no deformities or joint swelling noted.   Neuro: alert, no focal deficits  noted.    Skin: Warm, no lesions or rashes    Assessment & Plan:  ASTHMA Stable - will continue Symbicort bid + SABA prn.    RHINOSINUSITIS, ACUTE Same regimen.

## 2013-02-10 NOTE — Assessment & Plan Note (Signed)
Stable - will continue Symbicort bid + SABA prn.

## 2013-02-10 NOTE — Patient Instructions (Addendum)
Please continue your current medications as yo are taking them  Follow with Dr Delton Coombes in 6 months or sooner if you have any problems

## 2013-02-21 DIAGNOSIS — H26499 Other secondary cataract, unspecified eye: Secondary | ICD-10-CM | POA: Insufficient documentation

## 2013-09-07 ENCOUNTER — Encounter: Payer: Self-pay | Admitting: Emergency Medicine

## 2013-09-07 ENCOUNTER — Ambulatory Visit (INDEPENDENT_AMBULATORY_CARE_PROVIDER_SITE_OTHER): Payer: Medicare Other | Admitting: Emergency Medicine

## 2013-09-07 VITALS — BP 130/72 | HR 85 | Temp 98.6°F | Ht 67.0 in | Wt 175.2 lb

## 2013-09-07 DIAGNOSIS — J449 Chronic obstructive pulmonary disease, unspecified: Secondary | ICD-10-CM

## 2013-09-07 DIAGNOSIS — J018 Other acute sinusitis: Secondary | ICD-10-CM

## 2013-09-07 DIAGNOSIS — J45909 Unspecified asthma, uncomplicated: Secondary | ICD-10-CM

## 2013-09-07 NOTE — Assessment & Plan Note (Signed)
-   same regimen 

## 2013-09-07 NOTE — Assessment & Plan Note (Signed)
-   continue Symbicort + SABA + allergy control.

## 2013-09-07 NOTE — Assessment & Plan Note (Signed)
w hx COP.  - recheck CXR next visit

## 2013-09-07 NOTE — Patient Instructions (Signed)
Please continue your medications as you are taking them Follow with Dr Delton Coombes in 6 months with a CXR, or sooner if you have any problems

## 2013-09-07 NOTE — Progress Notes (Signed)
  Subjective:    Patient ID: [name], female    DOB: [birth year], 72 y.o.   MRN: [id number]  New visit 02/10/13 -- 72 y.o. 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  She was last seen here Feb '14 for dyspnea, cough, CP that she first noticed in January climbing stairs. She was treated with pred taper, continued on Symbicort. Returns now to report that her breathing is better, chest discomfort is better but still happens sometimes w exertion. Her cough is resolved. She uses albuterol a few times a month.  She is on nasonex, astepro prn.   ROV 09/07/13-- 72 y.o hx moderate obstruction, COP dx by bx in '07, allergies.  She still has exertional dyspnea with stairs or chores. She has backed off on nasal steroid, astepro. No flares. Tolerates symbicort, uses SABA about 2x a week.      Objective:  OBJ- Physical Exam Filed Vitals:   09/07/13 1330  BP: 130/72  Pulse: 85  Temp: 98.6 F (37 C)   GEN: A/Ox3; pleasant , NAD  HEENT:  [place]/AT,  EACs-clear, TMs-wnl, NOSE-clear, THROAT-clear, no lesions, no postnasal drip or exudate noted.   NECK:  Supple w/ fair ROM; no JVD; normal carotid impulses w/o bruits; no thyromegaly or nodules palpated; no lymphadenopathy.  RESP  Clear  P & A; w/o, wheezes/ rales/ or rhonchi.no accessory muscle use, no dullness to percussion  CARD:  RRR, no m/r/g  , no peripheral edema, pulses intact, no cyanosis or clubbing.  Musco: Warm bil, no deformities or joint swelling noted.   Neuro: alert, no focal deficits noted.    Skin: Warm, no lesions or rashes    Assessment & Plan:  ASTHMA - continue Symbicort + SABA + allergy control.  COPD w hx COP.  - recheck CXR next visit  RHINOSINUSITIS, ACUTE - same regimen

## 2013-09-16 ENCOUNTER — Ambulatory Visit (INDEPENDENT_AMBULATORY_CARE_PROVIDER_SITE_OTHER): Payer: Medicare Other | Admitting: Podiatrist

## 2013-09-16 ENCOUNTER — Encounter: Payer: Self-pay | Admitting: Podiatrist

## 2013-09-16 ENCOUNTER — Ambulatory Visit (INDEPENDENT_AMBULATORY_CARE_PROVIDER_SITE_OTHER): Payer: Medicare Other

## 2013-09-16 VITALS — BP 118/84 | HR 89 | Resp 16 | Ht 67.0 in | Wt 174.0 lb

## 2013-09-16 DIAGNOSIS — M79672 Pain in left foot: Secondary | ICD-10-CM

## 2013-09-16 DIAGNOSIS — M79609 Pain in unspecified limb: Secondary | ICD-10-CM

## 2013-09-16 NOTE — Progress Notes (Signed)
Subjective: [name] presents today with a new complaint of painful hammertoes second toes bilateral feet. She states that they began to hurt with shoes as they bump against the tops of her shoes. She's tried different shoes with minimal relief of symptoms. She's also tried some Tylenol arthritis medicine with minimal relief in symptoms. The patient is referred courtesy of Dr[facility]  Objective: Neurovascular status is intact and unchanged from the previous visit. Contraction deformity of the second digit bilaterally is noted. It does appear that the second digit is longer than the first and the third and has contracted to their length. No open lesions present no hyperkeratotic lesions noted minor redness of the proximal interphalangeal joint is seen bilateral second toes.  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 Discussed utilization of a pad to the second toes to prevent them from rubbing against her walking shoes. Discussed that if this is not beneficial shortening metatarsal osteotomy with hammertoe correction of the second digit bilaterally would be recommended. At this time the patient is going to try conservative therapies first and we'll notify me if she would like to proceed with surgical correction.

## 2013-09-16 NOTE — Patient Instructions (Signed)
You have hammertoes of your 2nd toes.  Try the pads and if this fails to relieve your symptoms the next step would be surgery.  Call if you need anything.

## 2013-09-30 ENCOUNTER — Ambulatory Visit
Admission: RE | Admit: 2013-09-30 | Discharge: 2013-09-30 | Disposition: A | Discharge status: Home / Self Care | Payer: Medicare Other | Source: Ambulatory Visit | Attending: Family Medicine | Admitting: Family Medicine

## 2013-09-30 ENCOUNTER — Other Ambulatory Visit: Payer: Self-pay | Admitting: Family Medicine

## 2013-09-30 DIAGNOSIS — R52 Pain, unspecified: Secondary | ICD-10-CM

## 2013-09-30 DIAGNOSIS — R609 Edema, unspecified: Secondary | ICD-10-CM

## 2013-10-11 ENCOUNTER — Other Ambulatory Visit (HOSPITAL_COMMUNITY): Payer: Self-pay | Admitting: Family Medicine

## 2013-10-11 DIAGNOSIS — Z1231 Encounter for screening mammogram for malignant neoplasm of breast: Secondary | ICD-10-CM

## 2013-10-21 ENCOUNTER — Ambulatory Visit (HOSPITAL_COMMUNITY)
Admission: RE | Admit: 2013-10-21 | Discharge: 2013-10-21 | Disposition: A | Discharge status: Home / Self Care | Payer: Medicare Other | Source: Ambulatory Visit | Attending: Family Medicine | Admitting: Family Medicine

## 2013-10-21 DIAGNOSIS — Z1231 Encounter for screening mammogram for malignant neoplasm of breast: Secondary | ICD-10-CM | POA: Insufficient documentation

## 2013-12-13 DIAGNOSIS — I83813 Varicose veins of bilateral lower extremities with pain: Secondary | ICD-10-CM | POA: Insufficient documentation

## 2013-12-13 DIAGNOSIS — I872 Venous insufficiency (chronic) (peripheral): Secondary | ICD-10-CM | POA: Insufficient documentation

## 2013-12-17 ENCOUNTER — Encounter: Payer: Self-pay | Admitting: *Deleted

## 2015-05-01 ENCOUNTER — Ambulatory Visit
Admission: RE | Admit: 2015-05-01 | Discharge: 2015-05-01 | Disposition: A | Discharge status: Home / Self Care | Payer: Medicare Other | Source: Ambulatory Visit | Attending: Family Medicine | Admitting: Family Medicine

## 2015-05-01 ENCOUNTER — Other Ambulatory Visit: Payer: Self-pay | Admitting: Family Medicine

## 2015-05-01 DIAGNOSIS — M25552 Pain in left hip: Secondary | ICD-10-CM

## 2015-05-23 ENCOUNTER — Ambulatory Visit (INDEPENDENT_AMBULATORY_CARE_PROVIDER_SITE_OTHER): Payer: Medicare Other | Admitting: Pulmonary Disease

## 2015-05-23 ENCOUNTER — Encounter: Payer: Self-pay | Admitting: Pulmonary Disease

## 2015-05-23 VITALS — BP 110/82 | HR 78 | Temp 98.6°F | Wt 176.8 lb

## 2015-05-23 DIAGNOSIS — J441 Chronic obstructive pulmonary disease with (acute) exacerbation: Secondary | ICD-10-CM

## 2015-05-23 DIAGNOSIS — J8489 Other specified interstitial pulmonary diseases: Secondary | ICD-10-CM | POA: Insufficient documentation

## 2015-05-23 MED ORDER — LEVOFLOXACIN 500 MG PO TABS: 500.0000 mg | ORAL_TABLET | Freq: Every day | ORAL | Status: DC

## 2015-05-23 MED ORDER — METHYLPREDNISOLONE 4 MG PO TBPK: ORAL_TABLET | ORAL | Status: DC

## 2015-05-23 MED ORDER — METHYLPREDNISOLONE ACETATE 80 MG/ML IJ SUSP: 80.0000 mg | Freq: Once | INTRAMUSCULAR | Status: DC

## 2015-05-23 NOTE — Progress Notes (Signed)
Subjective:     Patient ID: [name], female   DOB: 1941-02-06, 74 y.o.   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  She was last seen here Feb '14 for dyspnea, cough, CP that she first noticed in January climbing stairs. She was treated with pred taper, continued on Symbicort. Returns now to report that her breathing is better, chest discomfort is better but still happens sometimes w exertion. Her cough is resolved. She uses albuterol a few times a month.  She is on nasonex, astepro prn.   ROV 09/07/13 w/ [name]-- 74 yo hx moderate obstruction, COP dx by bx in '07, allergies.  She still has exertional dyspnea with stairs or chores. She has backed off on nasal steroid, astepro. No flares. Tolerates symbicort, uses SABA about 2x a week.   ~  May 23, 2015:  Add-on appt w/ [name]> 43 y/o WF, Pt of [name], last seen 08/2013, never smoker w/ hx BOOP/COP 2007 w/ RUL surg and resultant COPD; treated w/ Symbicort160-2spBid & AlbutHFA prn... Presents w/ a 2wk hx incr SOB, cough w/ yellow sput, chest tightness & wheezing;  It all started when she was in [facility] on vacation, said she was hot, sweaty, AC went out, she got weak, cough, sput, but denies f/c or hemoptysis, no CP... She notes her last antibiotic was ~45mo ago from her PCP [name]...  She was supposed to get PFT & 42minWT in 2013 but never did it...  Last CXR was 2/14 showing norm heart size, chronic changes w/ scarring right base, scoliosis, NAD.Marland Kitchen EXAM shows Afeb, VSS, O2sat= 96% on RA;  HEENT- neg, mallampati2;  Chest- few scat rhonchi at bases R>L, no wheezing, rales, signs of consolidation;  Heart- RR, w/o m/r/g;  Abd- soft, neg;  Ext- neg w/o c/c/e... IMP/PLAN>>  [name] appears to have a COPD exac, not toxic/ hypoxemic/ few rhonchi on exam;  Rec to take  LEVAQUIN500mg x7d, Align, and MEDROL Dosepak; continue Symbicort160-2spBid & prn AlbutHFA rescue inhaler;  She will f/u w/ [name] in 6wks for recheck...    Past Medical History  Diagnosis Date  . Other acute sinusitis   . Other diseases of nasal cavity and sinuses(478.19)   . Pure hypercholesterolemia   . Unspecified asthma(493.90)     Past Surgical History  Procedure Laterality Date  . Rotator cuff repair      bilateral  . Corneal transplant    . Tonsillectomy      twice  . Pneumonectomy      for BOOP Right 1/3    Outpatient Encounter Prescriptions as of 05/23/2015  Medication Sig  . albuterol (PROAIR HFA) 108 (90 BASE) MCG/ACT inhaler Inhale 2 puffs into the lungs 4 (four) times daily as needed.    . Alendronate Sodium (FOSAMAX PO) Take by mouth. TAKE ONE TABLET ONCE WEEKLY  . ALPRAZolam (XANAX XR) 0.5 MG 24 hr tablet Take 0.5 mg by mouth as needed for anxiety.  Marland Kitchen aspirin 81 MG tablet Take 81 mg by mouth daily.    Marland Kitchen atorvastatin (LIPITOR) 10 MG tablet Take 10 mg by mouth daily.  Marland Kitchen azelastine (ASTEPRO) 137 MCG/SPRAY nasal spray Place 1 spray into the nose 2 (two) times daily as needed.   . brinzolamide (AZOPT) 1 % ophthalmic suspension Place 1 drop  into both eyes 2 (two) times daily.  . budesonide-formoterol (SYMBICORT) 160-4.5 MCG/ACT inhaler Inhale 2 puffs into the lungs 2 (two) times daily.    . calcium-vitamin D (OSCAL) 250-125 MG-UNIT per tablet Take 1 tablet by mouth daily.    . Grape Seed OIL Take 1 capsule by mouth daily.   . mometasone (NASONEX) 50 MCG/ACT nasal spray Place 2 sprays into the nose daily.    . Omega-3 Fatty Acids (FISH OIL) 1000 MG CAPS Take 1 capsule by mouth daily.    . pantoprazole (PROTONIX) 40 MG tablet Take 40 mg by mouth daily.    Marland Kitchen SYNTHROID 50 MCG tablet TAKE 1 TABLET BY MOUTH ONCE EVERY MORNING ON AN EMPTY STOMACH  . traZODone (DESYREL) 100 MG tablet Take 100 mg by mouth at bedtime.  . Vitamin D, Cholecalciferol, 400 UNITS CHEW Chew 1 tablet  by mouth daily.   . Vitamin D, Ergocalciferol, (DRISDOL) 50000 UNITS CAPS capsule Take 50,000 Units by mouth every 7 (seven) days.  . [DISCONTINUED] levothyroxine (SYNTHROID, LEVOTHROID) 75 MCG tablet Take 50 mcg by mouth daily.   . [DISCONTINUED] zolpidem (AMBIEN) 10 MG tablet Take 10 mg by mouth at bedtime as needed.      Allergies  Allergen Reactions  . Alphagan [Brimonidine] Itching and Other (See Comments)    RED  . Oxycodone-Acetaminophen     INTOLERANT TO PERCOCET - flushing, redness  . Percocet [Oxycodone-Acetaminophen]     Rapid heart rate  . Travoprost     INTOLERANT TO TRAVATAN - eyes red and burning    Review of Systems           All symptoms NEG except where BOLDED >>  Constitutional:  F/C/S, fatigue, anorexia, unexpected weight change. HEENT:  HA, visual changes, hearing loss, earache, nasal symptoms, sore throat, mouth sores, hoarseness. Resp:  cough, sputum, hemoptysis; SOB, tightness, wheezing. Cardio:  CP, palpit, DOE, orthopnea, edema. GI:  N/V/D/C, blood in stool; reflux, abd pain, distention, gas. GU:  dysuria, freq, urgency, hematuria, flank pain, voiding difficulty. MS:  joint pain, swelling, tenderness, decr ROM; neck pain, back pain, etc. Neuro:  HA, tremors, seizures, dizziness, syncope, weakness, numbness, gait abn. Skin:  suspicious lesions or skin rash. Heme:  adenopathy, bruising, bleeding. Psyche:  confusion, agitation, sleep disturbance, hallucinations, anxiety, depression suicidal.   Objective:   Physical Exam     Filed Vitals:   05/23/15 1241  BP: 110/82  Pulse: 78  Temp: 98.6 F (37 C)   GEN: A/Ox3; pleasant , NAD  HEENT:  [place]/AT,  EACs-clear, TMs-wnl, NOSE-clear, THROAT-clear, no lesions, no postnasal drip or exudate noted.   NECK:  Supple w/ fair ROM; no JVD; normal carotid impulses w/o bruits; no thyromegaly or nodules palpated; no lymphadenopathy.  RESP  Few scat rhonchi at bases; w/o wheezes/ rales/ or consolidation; no  accessory muscle use, no dullness to percussion  CARD:  RRR, no m/r/g  , no peripheral edema, pulses intact, no cyanosis or clubbing.  Musco: Warm bil, no deformities or joint swelling noted.   Neuro: alert, no focal deficits noted.    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  Plan:     Patient's Medications  New Prescriptions   LEVOFLOXACIN (LEVAQUIN) 500 MG TABLET    Take 1 tablet (500 mg total) by mouth daily.   METHYLPREDNISOLONE (MEDROL DOSEPAK) 4 MG TBPK TABLET    Use as directed  Previous Medications   ALBUTEROL (PROAIR HFA) 108 (90 BASE) MCG/ACT INHALER    Inhale 2 puffs into the lungs 4 (four) times daily as needed.     ALENDRONATE SODIUM (FOSAMAX PO)    Take by mouth. TAKE ONE TABLET ONCE WEEKLY   ALPRAZOLAM (XANAX XR) 0.5 MG 24 HR TABLET    Take 0.5 mg by mouth as needed for anxiety.   ASPIRIN 81 MG TABLET    Take 81 mg by mouth daily.     ATORVASTATIN (LIPITOR) 10 MG TABLET    Take 10 mg by mouth daily.   AZELASTINE (ASTEPRO) 137 MCG/SPRAY NASAL SPRAY    Place 1 spray into the nose 2 (two) times daily as needed.    BRINZOLAMIDE (AZOPT) 1 % OPHTHALMIC SUSPENSION    Place 1 drop into both eyes 2 (two) times daily.   BUDESONIDE-FORMOTEROL (SYMBICORT) 160-4.5 MCG/ACT INHALER    Inhale 2 puffs into the lungs 2 (two) times daily.     CALCIUM-VITAMIN D (OSCAL) 250-125 MG-UNIT PER TABLET    Take 1 tablet by mouth daily.     GRAPE SEED OIL    Take 1 capsule by mouth daily.    MOMETASONE (NASONEX) 50 MCG/ACT NASAL SPRAY    Place 2 sprays into the nose daily.     OMEGA-3 FATTY ACIDS (FISH OIL) 1000 MG CAPS    Take 1 capsule by mouth daily.     PANTOPRAZOLE  (PROTONIX) 40 MG TABLET    Take 40 mg by mouth daily.     SYNTHROID 50 MCG TABLET    TAKE 1 TABLET BY MOUTH ONCE EVERY MORNING ON AN EMPTY STOMACH   TRAZODONE (DESYREL) 100 MG TABLET    Take 100 mg by mouth at bedtime.   VITAMIN D, CHOLECALCIFEROL, 400 UNITS CHEW    Chew 1 tablet by mouth daily.    VITAMIN D, ERGOCALCIFEROL, (DRISDOL) 50000 UNITS CAPS CAPSULE    Take 50,000 Units by mouth every 7 (seven) days.  Modified Medications   No medications on file  Discontinued Medications   LEVOTHYROXINE (SYNTHROID, LEVOTHROID) 75 MCG TABLET    Take 50 mcg by mouth daily.    ZOLPIDEM (AMBIEN) 10 MG TABLET    Take 10 mg by mouth at bedtime as needed.

## 2015-05-23 NOTE — Patient Instructions (Signed)
Today we updated your med list in our EPIC system...    Continue your current medications the same...  We decided to treat your resp tract infection w/ LEVAQUIN 500mg - one tab daily til gone...  Always remember to take an OTC PROBIOTIC like Align one daily when you are on a broad spectrum antibiotic...  We als gave you a MEDROL Dosepak for the airway inflammation- take as directed on the pack...  Rest at home, drink lots of fluids, call for any questions...  You should plan a follow up visit w/ drByrum in about 6-8 weeks.Marland KitchenMarland Kitchen

## 2015-07-04 ENCOUNTER — Ambulatory Visit: Payer: Medicare Other | Admitting: Emergency Medicine

## 2015-07-10 ENCOUNTER — Encounter: Payer: Self-pay | Admitting: Emergency Medicine

## 2015-07-10 ENCOUNTER — Ambulatory Visit (INDEPENDENT_AMBULATORY_CARE_PROVIDER_SITE_OTHER): Payer: Medicare Other | Admitting: Emergency Medicine

## 2015-07-10 ENCOUNTER — Ambulatory Visit (INDEPENDENT_AMBULATORY_CARE_PROVIDER_SITE_OTHER)
Admission: RE | Admit: 2015-07-10 | Discharge: 2015-07-10 | Disposition: A | Discharge status: Home / Self Care | Payer: Medicare Other | Source: Ambulatory Visit | Attending: Emergency Medicine | Admitting: Emergency Medicine

## 2015-07-10 VITALS — BP 130/70 | HR 66 | Ht 66.5 in | Wt 181.0 lb

## 2015-07-10 DIAGNOSIS — J8489 Other specified interstitial pulmonary diseases: Secondary | ICD-10-CM

## 2015-07-10 DIAGNOSIS — J441 Chronic obstructive pulmonary disease with (acute) exacerbation: Secondary | ICD-10-CM

## 2015-07-10 NOTE — Progress Notes (Signed)
Subjective:     Patient ID: [name], female   DOB: 06/21/1941, 74 y.o.   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  She was last seen here Feb '14 for dyspnea, cough, CP that she first noticed in January climbing stairs. She was treated with pred taper, continued on Symbicort. Returns now to report that her breathing is better, chest discomfort is better but still happens sometimes w exertion. Her cough is resolved. She uses albuterol a few times a month.  She is on nasonex, astepro prn.   ROV 09/07/13 w/ RB-- 74 yo hx moderate obstruction, COP dx by bx in '07, allergies.  She still has exertional dyspnea with stairs or chores. She has backed off on nasal steroid, astepro. No flares. Tolerates symbicort, uses SABA about 2x a week.   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 Presents w/ a 2wk hx incr SOB, cough w/ yellow sput, chest tightness & wheezing;  It all started when she was in [facility] on vacation, said she was hot, sweaty, AC went out, she got weak, cough, sput, but denies f/c or hemoptysis, no CP... She notes her last antibiotic was ~80mo ago from her PCP DrCSmith...  She was supposed to get PFT & 28minWT in 2013 but never did it...  Last CXR was 2/14 showing norm heart size, chronic changes w/ scarring right base, scoliosis, NAD.Marland Kitchen EXAM shows Afeb, VSS, O2sat= 96% on RA;  HEENT- neg, mallampati2;  Chest- few scat rhonchi at bases R>L, no wheezing, rales, signs of consolidation;  Heart- RR, w/o m/r/g;  Abd- soft, neg;  Ext- neg w/o c/c/e... IMP/PLAN>>  [name] appears to have a COPD exac, not toxic/ hypoxemic/ few rhonchi on exam;  Rec to take  LEVAQUIN500mg x7d, Align, and MEDROL Dosepak; continue Symbicort160-2spBid & prn AlbutHFA rescue inhaler;  She will f/u w/ DrByrum in 6wks for recheck...  ROV 07/10/15 -- follow-up visit. I have not seen her since 2014. I have followed her for obstructive lung disease documented on pulmonary function testing in 2010. She also has a history of COP diagnosed with right upper lobe surgery in 2007. She has been managed on Symbicort and albuterol as needed. She was seen by Dr. Lenna Gilford in July 2016 in the setting of wheezing, chest tightness sputum consistent with an exacerbation of COPD. She has improved now after treatment with levofloxacin and Medrol.  She tells me that she is feeling better but may not be back to full strength yet. She uses ventolin a few times a week. She reports that she has had 2 other flares besides the one w Dr Lenna Gilford.    Past Medical History  Diagnosis Date  . Other acute sinusitis   . Other diseases of nasal cavity and sinuses(478.19)   . Pure hypercholesterolemia   . Unspecified asthma(493.90)     Past Surgical History  Procedure Laterality Date  . Rotator cuff repair      bilateral  . Corneal transplant    . Tonsillectomy      twice  . Pneumonectomy      for BOOP Right 1/3  Outpatient Encounter Prescriptions as of 05/23/2015  Medication Sig  . albuterol (PROAIR HFA) 108 (90 BASE) MCG/ACT inhaler Inhale 2 puffs into the lungs 4 (four) times daily as needed.    . Alendronate Sodium (FOSAMAX PO) Take by mouth. TAKE ONE TABLET ONCE WEEKLY  . ALPRAZolam (XANAX XR) 0.5 MG 24 hr tablet Take 0.5 mg by mouth as needed for anxiety.  Marland Kitchen aspirin 81 MG tablet Take 81 mg by mouth daily.    Marland Kitchen atorvastatin (LIPITOR) 10 MG tablet Take 10 mg by mouth daily.  Marland Kitchen azelastine (ASTEPRO) 137 MCG/SPRAY nasal spray Place 1 spray into the nose 2 (two) times daily as needed.   . brinzolamide (AZOPT) 1 % ophthalmic suspension Place 1 drop into both eyes 2 (two) times daily.  .  budesonide-formoterol (SYMBICORT) 160-4.5 MCG/ACT inhaler Inhale 2 puffs into the lungs 2 (two) times daily.    . calcium-vitamin D (OSCAL) 250-125 MG-UNIT per tablet Take 1 tablet by mouth daily.    . Grape Seed OIL Take 1 capsule by mouth daily.   . mometasone (NASONEX) 50 MCG/ACT nasal spray Place 2 sprays into the nose daily.    . Omega-3 Fatty Acids (FISH OIL) 1000 MG CAPS Take 1 capsule by mouth daily.    . pantoprazole (PROTONIX) 40 MG tablet Take 40 mg by mouth daily.    Marland Kitchen SYNTHROID 50 MCG tablet TAKE 1 TABLET BY MOUTH ONCE EVERY MORNING ON AN EMPTY STOMACH  . traZODone (DESYREL) 100 MG tablet Take 100 mg by mouth at bedtime.  . Vitamin D, Cholecalciferol, 400 UNITS CHEW Chew 1 tablet by mouth daily.   . Vitamin D, Ergocalciferol, (DRISDOL) 50000 UNITS CAPS capsule Take 50,000 Units by mouth every 7 (seven) days.  . [DISCONTINUED] levothyroxine (SYNTHROID, LEVOTHROID) 75 MCG tablet Take 50 mcg by mouth daily.   . [DISCONTINUED] zolpidem (AMBIEN) 10 MG tablet Take 10 mg by mouth at bedtime as needed.      Allergies  Allergen Reactions  . Alphagan [Brimonidine] Itching and Other (See Comments)    RED  . Oxycodone-Acetaminophen     INTOLERANT TO PERCOCET - flushing, redness  . Percocet [Oxycodone-Acetaminophen]     Rapid heart rate  . Travoprost     INTOLERANT TO TRAVATAN - eyes red and burning    Review of Systems As per HPI   Objective:   Physical Exam     Filed Vitals:   05/23/15 1241  BP: 110/82  Pulse: 78  Temp: 98.6 F (37 C)   GEN: A/Ox3; pleasant , NAD  HEENT:  [place]/AT,  EACs-clear, TMs-wnl, NOSE-clear, THROAT-clear, no lesions, no postnasal drip or exudate noted.   NECK:  Supple w/ fair ROM; no JVD; normal carotid impulses w/o bruits; no thyromegaly or nodules palpated; no lymphadenopathy.  RESP  Few scat rhonchi at bases; w/o wheezes/ rales/ or consolidation; no accessory muscle use, no dullness to percussion  CARD:  RRR, no m/r/g  , no peripheral edema,  pulses intact, no cyanosis or clubbing.  Musco: Warm bil, no deformities or joint swelling noted.   Neuro: alert, no focal deficits noted.    Skin: Warm, no lesions or rashes   Assessment:     BOOP (bronchiolitis obliterans with organizing pneumonia) History of COP without evidence of recurrence. I will repeat her chest x-ray to compare with prior  COPD (chronic obstructive pulmonary disease) Suspect this is related to chronic asthma, whether there may be some contribution from her history of COP. We will repeat her pulmonary  function testing before committing to changing her Symbicort to an alternative. I'll discuss the testing and medication options with her next available

## 2015-07-10 NOTE — Patient Instructions (Signed)
Please continue Symbicort as you have been taking it Keep albuterol available to use as needed We will repeat your pulmonary function testing to compare with your prior We will perform a chest x-ray to compare with your prior Follow with Dr Lamonte Sakai next available with full PFT

## 2015-07-10 NOTE — Assessment & Plan Note (Addendum)
History of COP without evidence of recurrence. I will repeat her chest x-ray to compare with prior

## 2015-07-10 NOTE — Addendum Note (Signed)
Addended by: Desmond Dike C on: 07/10/2015 02:58 PM   Modules accepted: Orders

## 2015-07-10 NOTE — Assessment & Plan Note (Signed)
Suspect this is related to chronic asthma, whether there may be some contribution from her history of COP. We will repeat her pulmonary function testing before committing to changing her Symbicort to an alternative. I'll discuss the testing and medication options with her next available

## 2015-08-09 ENCOUNTER — Ambulatory Visit (INDEPENDENT_AMBULATORY_CARE_PROVIDER_SITE_OTHER): Payer: Medicare Other | Admitting: Emergency Medicine

## 2015-08-09 ENCOUNTER — Encounter: Payer: Self-pay | Admitting: Emergency Medicine

## 2015-08-09 VITALS — BP 148/78 | HR 85 | Ht 65.0 in | Wt 182.0 lb

## 2015-08-09 DIAGNOSIS — J441 Chronic obstructive pulmonary disease with (acute) exacerbation: Secondary | ICD-10-CM | POA: Diagnosis not present

## 2015-08-09 DIAGNOSIS — Z23 Encounter for immunization: Secondary | ICD-10-CM

## 2015-08-09 LAB — PULMONARY FUNCTION TEST
DL/VA % pred: 93 %
DL/VA: 4.61 ml/min/mmHg/L
DLCO unc % pred: 80 %
DLCO unc: 20.71 ml/min/mmHg
FEF 25-75 POST: 0.7 L/s
FEF 25-75 Pre: 0.71 L/sec
FEF2575-%Change-Post: -2 %
FEF2575-%PRED-PRE: 40 %
FEF2575-%Pred-Post: 39 %
FEV1-%Change-Post: 1 %
FEV1-%PRED-PRE: 75 %
FEV1-%Pred-Post: 76 %
FEV1-PRE: 1.69 L
FEV1-Post: 1.72 L
FEV1FVC-%Change-Post: 3 %
FEV1FVC-%PRED-PRE: 76 %
FEV6-%Change-Post: 0 %
FEV6-%PRED-POST: 101 %
FEV6-%Pred-Pre: 102 %
FEV6-Post: 2.89 L
FEV6-Pre: 2.91 L
FEV6FVC-%Change-Post: 1 %
FEV6FVC-%PRED-POST: 105 %
FEV6FVC-%Pred-Pre: 103 %
FVC-%Change-Post: -1 %
FVC-%Pred-Post: 97 %
FVC-%Pred-Pre: 99 %
FVC-Post: 2.9 L
FVC-Pre: 2.95 L
PRE FEV1/FVC RATIO: 57 %
Post FEV1/FVC ratio: 59 %
Post FEV6/FVC ratio: 100 %
Pre FEV6/FVC Ratio: 99 %
RV % pred: 77 %
RV: 1.8 L
TLC % PRED: 87 %
TLC: 4.55 L

## 2015-08-09 MED ORDER — TIOTROPIUM BROMIDE MONOHYDRATE 18 MCG IN CAPS: 18.0000 ug | ORAL_CAPSULE | Freq: Every day | RESPIRATORY_TRACT | Status: DC

## 2015-08-09 NOTE — Assessment & Plan Note (Signed)
COPD related to fixed asthma pattern. Her FEV1 on pulmonary function testing from today is slightly decreased compared with 2010. I believe it will be worthwhile to attempt an addition of Spiriva to her Symbicort to see if she benefits. We will do this for one month and then she will call us to let us know if she feels there is value in  continuing this medication

## 2015-08-09 NOTE — Progress Notes (Signed)
PFT done today. 

## 2015-08-09 NOTE — Patient Instructions (Signed)
We will do a trial of adding Spiriva once a day Continue Symbicort 2 puffs twice a day Please call our office in 3-4 weeks to let us know if you have benefited from the [place]. If so then we will continue this medication until her next follow-up.  Flu shot today Follow with Dr Lamonte Sakai in 6 months or sooner if you have any problems

## 2015-08-09 NOTE — Progress Notes (Signed)
Subjective:     Patient ID: [name], female   DOB: 04-Jun-1941, 74 y.o.   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  She was last seen here Feb '14 for dyspnea, cough, CP that she first noticed in January climbing stairs. She was treated with pred taper, continued on Symbicort. Returns now to report that her breathing is better, chest discomfort is better but still happens sometimes w exertion. Her cough is resolved. She uses albuterol a few times a month.  She is on nasonex, astepro prn.   ROV 09/07/13 w/ RB-- 74 yo hx moderate obstruction, COP dx by bx in '07, allergies.  She still has exertional dyspnea with stairs or chores. She has backed off on nasal steroid, astepro. No flares. Tolerates symbicort, uses SABA about 2x a week.   ~  May 23, 2015:  Add-on appt w/ SN> 81 y/o WF, [name], last seen 08/2013, never smoker w/ hx BOOP/COP 2007 w/ RUL surg and resultant COPD; treated w/ Symbicort160-2spBid & AlbutHFA prn... Presents w/ a 2wk hx incr SOB, cough w/ yellow sput, chest tightness & wheezing;  It all started when she was in [facility] on vacation, said she was hot, sweaty, AC went out, she got weak, cough, sput, but denies f/c or hemoptysis, no CP... She notes her last antibiotic was ~24mo ago from her PCP DrCSmith...  She was supposed to get PFT & 49minWT in 2013 but never did it...  Last CXR was 2/14 showing norm heart size, chronic changes w/ scarring right base, scoliosis, NAD.Marland Kitchen EXAM shows Afeb, VSS, O2sat= 96% on RA;  HEENT- neg, mallampati2;  Chest- few scat rhonchi at bases R>L, no wheezing, rales, signs of consolidation;  Heart- RR, w/o m/r/g;  Abd- soft, neg;  Ext- neg w/o c/c/e... IMP/PLAN>>  Nolita appears to have a COPD exac, not toxic/ hypoxemic/ few rhonchi on exam;  Rec to take  LEVAQUIN500mg x7d, Align, and MEDROL Dosepak; continue Symbicort160-2spBid & prn AlbutHFA rescue inhaler;  She will f/u w/ DrByrum in 6wks for recheck...  ROV 07/10/15 -- follow-up visit. I have not seen her since 2014. I have followed her for obstructive lung disease documented on pulmonary function testing in 2010. She also has a history of COP diagnosed with right upper lobe surgery in 2007. She has been managed on Symbicort and albuterol as needed. She was seen by Dr. Lenna Gilford in July 2016 in the setting of wheezing, chest tightness sputum consistent with an exacerbation of COPD. She has improved now after treatment with levofloxacin and Medrol.  She tells me that she is feeling better but may not be back to full strength yet. She uses ventolin a few times a week. She reports that she has had 2 other flares besides the one w Dr Lenna Gilford.   ROV 08/07/15 -- follow-up visit for obstructive lung disease and a history of COP with prior right upper lobe biopsy in 2007. She follows up today after pulmonary function testing. I have reviewed the results personally and these show moderate obstructive lung disease without bronchodilator response, evidence of restriction based on a decreased residual volume and a normal diffusion capacity. Her FEV1 has decreased slightly compared with 2010, currently 75% of predicted compared with 82% of predicted. She tells me that she  is feeling ok, has some exertional SOB with stairs   Past Medical History  Diagnosis Date  . Other acute sinusitis   . Other diseases of nasal cavity and sinuses(478.19)   . Pure hypercholesterolemia   . Unspecified asthma(493.90)     Past Surgical History  Procedure Laterality Date  . Rotator cuff repair      bilateral  . Corneal transplant    . Tonsillectomy      twice  . Pneumonectomy      for BOOP Right 1/3    Outpatient Encounter Prescriptions as of 05/23/2015  Medication Sig  . albuterol (PROAIR HFA) 108 (90 BASE) MCG/ACT inhaler  Inhale 2 puffs into the lungs 4 (four) times daily as needed.    . Alendronate Sodium (FOSAMAX PO) Take by mouth. TAKE ONE TABLET ONCE WEEKLY  . ALPRAZolam (XANAX XR) 0.5 MG 24 hr tablet Take 0.5 mg by mouth as needed for anxiety.  Marland Kitchen aspirin 81 MG tablet Take 81 mg by mouth daily.    Marland Kitchen atorvastatin (LIPITOR) 10 MG tablet Take 10 mg by mouth daily.  Marland Kitchen azelastine (ASTEPRO) 137 MCG/SPRAY nasal spray Place 1 spray into the nose 2 (two) times daily as needed.   . brinzolamide (AZOPT) 1 % ophthalmic suspension Place 1 drop into both eyes 2 (two) times daily.  . budesonide-formoterol (SYMBICORT) 160-4.5 MCG/ACT inhaler Inhale 2 puffs into the lungs 2 (two) times daily.    . calcium-vitamin D (OSCAL) 250-125 MG-UNIT per tablet Take 1 tablet by mouth daily.    . Grape Seed OIL Take 1 capsule by mouth daily.   . mometasone (NASONEX) 50 MCG/ACT nasal spray Place 2 sprays into the nose daily.    . Omega-3 Fatty Acids (FISH OIL) 1000 MG CAPS Take 1 capsule by mouth daily.    . pantoprazole (PROTONIX) 40 MG tablet Take 40 mg by mouth daily.    Marland Kitchen SYNTHROID 50 MCG tablet TAKE 1 TABLET BY MOUTH ONCE EVERY MORNING ON AN EMPTY STOMACH  . traZODone (DESYREL) 100 MG tablet Take 100 mg by mouth at bedtime.  . Vitamin D, Cholecalciferol, 400 UNITS CHEW Chew 1 tablet by mouth daily.   . Vitamin D, Ergocalciferol, (DRISDOL) 50000 UNITS CAPS capsule Take 50,000 Units by mouth every 7 (seven) days.  . [DISCONTINUED] levothyroxine (SYNTHROID, LEVOTHROID) 75 MCG tablet Take 50 mcg by mouth daily.   . [DISCONTINUED] zolpidem (AMBIEN) 10 MG tablet Take 10 mg by mouth at bedtime as needed.      Allergies  Allergen Reactions  . Alphagan [Brimonidine] Itching and Other (See Comments)    RED  . Percocet [Oxycodone-Acetaminophen]     Rapid heart rate  . Travoprost     INTOLERANT TO TRAVATAN - eyes red and burning    Review of Systems As per HPI   Objective:   Physical Exam Filed Vitals:   08/09/15 1620  BP: 148/78   Pulse: 85  Height: 5\' 5"  (1.651 m)  Weight: 182 lb (82.555 kg)  SpO2: 97%   Gen: Pleasant, well-nourished, in no distress,  normal affect  ENT: No lesions,  mouth clear,  oropharynx clear, no postnasal drip  Neck: No JVD, no TMG, no carotid bruits  Lungs: No use of accessory muscles, clear without rales or rhonchi  Cardiovascular: RRR, heart sounds normal, no murmur or gallops, no peripheral edema  Musculoskeletal: No deformities, no cyanosis or clubbing  Neuro: alert, non focal  Skin: Warm, no lesions or rashes      Assessment:  COPD (chronic obstructive pulmonary disease) COPD related to fixed asthma pattern. Her FEV1 on pulmonary function testing from today is slightly decreased compared with 2010. I believe it will be worthwhile to attempt an addition of Spiriva to her Symbicort to see if she benefits. We will do this for one month and then she will call us to let us know if she feels there is value in  continuing this medication

## 2016-01-08 DIAGNOSIS — Z961 Presence of intraocular lens: Secondary | ICD-10-CM | POA: Insufficient documentation

## 2016-01-24 ENCOUNTER — Ambulatory Visit: Payer: Medicare Other | Admitting: Podiatry

## 2016-02-08 ENCOUNTER — Encounter: Payer: Self-pay | Admitting: Emergency Medicine

## 2016-02-08 ENCOUNTER — Encounter (INDEPENDENT_AMBULATORY_CARE_PROVIDER_SITE_OTHER): Payer: Self-pay

## 2016-02-08 ENCOUNTER — Ambulatory Visit (INDEPENDENT_AMBULATORY_CARE_PROVIDER_SITE_OTHER): Payer: Medicare Other | Admitting: Emergency Medicine

## 2016-02-08 VITALS — BP 128/72 | HR 95 | Ht 66.5 in | Wt 169.0 lb

## 2016-02-08 DIAGNOSIS — J8489 Other specified interstitial pulmonary diseases: Secondary | ICD-10-CM

## 2016-02-08 DIAGNOSIS — J441 Chronic obstructive pulmonary disease with (acute) exacerbation: Secondary | ICD-10-CM

## 2016-02-08 NOTE — Progress Notes (Signed)
Subjective:     Patient ID: [name], female   DOB: 23-Aug-1941, 75 y.o.   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  She was last seen here Feb '14 for dyspnea, cough, CP that she first noticed in January climbing stairs. She was treated with pred taper, continued on Symbicort. Returns now to report that her breathing is better, chest discomfort is better but still happens sometimes w exertion. Her cough is resolved. She uses albuterol a few times a month.  She is on nasonex, astepro prn.   ROV 09/07/13 w/ RB-- 75 yo hx moderate obstruction, COP dx by bx in '07, allergies.  She still has exertional dyspnea with stairs or chores. She has backed off on nasal steroid, astepro. No flares. Tolerates symbicort, uses SABA about 2x a week.   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 Presents w/ a 2wk hx incr SOB, cough w/ yellow sput, chest tightness & wheezing;  It all started when she was in [facility] on vacation, said she was hot, sweaty, AC went out, she got weak, cough, sput, but denies f/c or hemoptysis, no CP... She notes her last antibiotic was ~19mo ago from her PCP DrCSmith...  She was supposed to get PFT & 77minWT in 2013 but never did it...  Last CXR was 2/14 showing norm heart size, chronic changes w/ scarring right base, scoliosis, NAD.Marland Kitchen EXAM shows Afeb, VSS, O2sat= 96% on RA;  HEENT- neg, mallampati2;  Chest- few scat rhonchi at bases R>L, no wheezing, rales, signs of consolidation;  Heart- RR, w/o m/r/g;  Abd- soft, neg;  Ext- neg w/o c/c/e... IMP/PLAN>>  [name], not toxic/ hypoxemic/ few rhonchi on exam;  Rec to take  LEVAQUIN500mg x7d, Align, and MEDROL Dosepak; continue Symbicort160-2spBid & prn AlbutHFA rescue inhaler;  She will f/u w/ DrByrum in 6wks for recheck...  ROV 07/10/15 -- follow-up visit. I have not seen her since 2014. I have followed her for obstructive lung disease documented on pulmonary function testing in 2010. She also has a history of COP diagnosed with right upper lobe surgery in 2007. She has been managed on Symbicort and albuterol as needed. She was seen by Dr. Lenna Gilford in July 2016 in the setting of wheezing, chest tightness sputum consistent with an exacerbation of COPD. She has improved now after treatment with levofloxacin and Medrol.  She tells me that she is feeling better but may not be back to full strength yet. She uses ventolin a few times a week. She reports that she has had 2 other flares besides the one w Dr Lenna Gilford.   ROV 08/07/15 -- follow-up visit for obstructive lung disease and a history of COP with prior right upper lobe biopsy in 2007. She follows up today after pulmonary function testing. I have reviewed the results personally pulmonary and these show moderate obstructive lung disease without bronchodilator response, evidence of restriction based on a decreased residual volume and a normal diffusion capacity. Her FEV1 has decreased slightly compared with 2010, currently 75% of predicted compared with 82% of predicted. 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 Her pulmonary function testing from 08/09/15 as detailed above. At that time we did a trial of adding Spiriva to her Symbicort to see if she would benefit. She didn't notice any improvement and she didn;t like the powder. She believes her overall breathing is better, she may have less dyspnea. She is not doing much exercise.  No real cough. Occasionally  has some wheeze.    Past Medical History  Diagnosis Date  . Other acute sinusitis   . Other diseases of nasal cavity and sinuses(478.19)   . Pure hypercholesterolemia   . Unspecified asthma(493.90)     Past Surgical History  Procedure Laterality Date  . Rotator cuff repair      bilateral  . Corneal transplant    . Tonsillectomy      twice  . Pneumonectomy      for BOOP Right 1/3    Current outpatient prescriptions:  .  albuterol (PROAIR HFA) 108 (90 BASE) MCG/ACT inhaler, Inhale 2 puffs into the lungs 4 (four) times daily as needed.  , Disp: , Rfl:  .  Alendronate Sodium (FOSAMAX PO), Take by mouth. TAKE ONE TABLET ONCE WEEKLY, Disp: , Rfl:  .  ALPRAZolam (XANAX XR) 0.5 MG 24 hr tablet, Take 0.5 mg by mouth as needed for anxiety., Disp: , Rfl:  .  aspirin 81 MG tablet, Take 81 mg by mouth daily.  , Disp: , Rfl:  .  atorvastatin (LIPITOR) 10 MG tablet, Take 10 mg by mouth daily., Disp: , Rfl:  .  azelastine (ASTEPRO) 137 MCG/SPRAY nasal spray, Place 1 spray into the nose 2 (two) times daily as needed. , Disp: , Rfl:  .  brinzolamide (AZOPT) 1 % ophthalmic suspension, Place 1 drop into both eyes 2 (two) times daily., Disp: , Rfl:  .  budesonide-formoterol (SYMBICORT) 160-4.5 MCG/ACT inhaler, Inhale 2 puffs into the lungs 2 (two) times daily.  , Disp: , Rfl:  .  calcium-vitamin D (OSCAL) 250-125 MG-UNIT per tablet, Take 1 tablet by mouth daily.  , Disp: , Rfl:  .  Grape Seed OIL, Take 1 capsule by mouth daily. , Disp: , Rfl:  .  mometasone (NASONEX) 50 MCG/ACT nasal spray, Place 2 sprays into the nose daily as needed. , Disp: , Rfl:  .  Omega-3 Fatty Acids (FISH OIL) 1000 MG CAPS, Take 1 capsule by mouth daily.  , Disp: , Rfl:  .  pantoprazole (PROTONIX) 40 MG tablet, Take 40 mg by mouth daily.  , Disp: , Rfl:  .  rOPINIRole (REQUIP) 1 MG tablet, Take 1 mg by mouth at bedtime., Disp: , Rfl:  .  SYNTHROID 50 MCG tablet, TAKE 1 TABLET BY MOUTH ONCE EVERY MORNING ON AN EMPTY STOMACH,  Disp: , Rfl: 5 .  tiotropium (SPIRIVA) 18 MCG inhalation capsule, Place 1 capsule (18 mcg total) into inhaler and inhale daily., Disp: 30 capsule, Rfl: 0 .  traMADol (ULTRAM) 50 MG tablet, Take by mouth every 6 (six) hours as needed., Disp: , Rfl:  .  traZODone (DESYREL) 100 MG tablet, Take 100 mg by mouth at bedtime., Disp: , Rfl: 1 .  Vitamin D, Cholecalciferol, 400 UNITS CHEW, Chew 1 tablet by mouth daily. , Disp: , Rfl:  .  Vitamin D, Ergocalciferol, (DRISDOL) 50000 UNITS CAPS capsule, Take 50,000 Units  by mouth every 7 (seven) days., Disp: , Rfl:    Review of Systems As per HPI   Objective:   Physical Exam Filed Vitals:   02/08/16 1404  BP: 128/72  Pulse: 95  Height: 5' 6.5" (1.689 m)  Weight: 169 lb (76.658 kg)  SpO2: 95%   Gen: Pleasant, well-nourished, in no distress,  normal affect  ENT: No lesions,  mouth clear,  oropharynx clear, no postnasal drip  Neck: No JVD, no TMG, no carotid bruits  Lungs: No use of accessory muscles, clear without rales or rhonchi  Cardiovascular: RRR, heart sounds normal, no murmur or gallops, no peripheral edema  Musculoskeletal: No deformities, no cyanosis or clubbing  Neuro: alert, non focal  Skin: Warm, no lesions or rashes   Assessment:     BOOP (bronchiolitis obliterans with organizing pneumonia) No current evidence for any recurrence. We will plan to follow her chest x-ray at least annually, sooner she has symptoms  COPD (chronic obstructive pulmonary disease) Likely  due to fixed asthma with possible contribution of her previous organizing pneumonia. She did not tolerate or benefit from Spiriva. We will continue Symbicort twice a day and albuterol as needed.

## 2016-02-08 NOTE — Patient Instructions (Addendum)
Please continue your symbicort as you have been taking it Take albuterol 2 puffs up to every 4 hours if needed for shortness of breath.  We will be sure to follow your CXR at least once a year.  Continue your physical therapy as you are doing it.  Follow with Dr Lamonte Sakai in 6 months or sooner if you have any problems

## 2016-02-08 NOTE — Assessment & Plan Note (Signed)
Likely  due to fixed asthma with possible contribution of her previous organizing pneumonia. She did not tolerate or benefit from Spiriva. We will continue Symbicort twice a day and albuterol as needed.

## 2016-02-08 NOTE — Assessment & Plan Note (Signed)
No current evidence for any recurrence. We will plan to follow her chest x-ray at least annually, sooner she has symptoms

## 2016-02-12 DIAGNOSIS — Z9889 Other specified postprocedural states: Secondary | ICD-10-CM | POA: Insufficient documentation

## 2016-04-15 ENCOUNTER — Ambulatory Visit (INDEPENDENT_AMBULATORY_CARE_PROVIDER_SITE_OTHER): Payer: Medicare Other | Admitting: Adult Health

## 2016-04-15 ENCOUNTER — Encounter: Payer: Self-pay | Admitting: Adult Health

## 2016-04-15 VITALS — BP 138/80 | HR 83 | Temp 98.5°F | Ht 66.5 in | Wt 171.4 lb

## 2016-04-15 DIAGNOSIS — J4531 Mild persistent asthma with (acute) exacerbation: Secondary | ICD-10-CM

## 2016-04-15 MED ORDER — PREDNISONE 10 MG PO TABS
ORAL_TABLET | ORAL | Status: DC
Start: 2016-04-15 — End: 2016-09-15

## 2016-04-15 MED ORDER — AZITHROMYCIN 250 MG PO TABS: ORAL_TABLET | ORAL | Status: AC

## 2016-04-15 NOTE — Progress Notes (Signed)
Subjective:     Patient ID: [name], female   DOB: 23-Aug-1941, 75 y.o.   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  She was last seen here Feb '14 for dyspnea, cough, CP that she first noticed in January climbing stairs. She was treated with pred taper, continued on Symbicort. Returns now to report that her breathing is better, chest discomfort is better but still happens sometimes w exertion. Her cough is resolved. She uses albuterol a few times a month.  She is on nasonex, astepro prn.   ROV 09/07/13 w/ RB-- 75 yo hx moderate obstruction, COP dx by bx in '07, allergies.  She still has exertional dyspnea with stairs or chores. She has backed off on nasal steroid, astepro. No flares. Tolerates symbicort, uses SABA about 2x a week.   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 Presents w/ a 2wk hx incr SOB, cough w/ yellow sput, chest tightness & wheezing;  It all started when she was in [facility] on vacation, said she was hot, sweaty, AC went out, she got weak, cough, sput, but denies f/c or hemoptysis, no CP... She notes her last antibiotic was ~19mo ago from her PCP DrCSmith...  She was supposed to get PFT & 77minWT in 2013 but never did it...  Last CXR was 2/14 showing norm heart size, chronic changes w/ scarring right base, scoliosis, NAD.Marland Kitchen EXAM shows Afeb, VSS, O2sat= 96% on RA;  HEENT- neg, mallampati2;  Chest- few scat rhonchi at bases R>L, no wheezing, rales, signs of consolidation;  Heart- RR, w/o m/r/g;  Abd- soft, neg;  Ext- neg w/o c/c/e... IMP/PLAN>>  Marlinda appears to have a COPD exac, not toxic/ hypoxemic/ few rhonchi on exam;  Rec to take  LEVAQUIN500mg x7d, Align, and MEDROL Dosepak; continue Symbicort160-2spBid & prn AlbutHFA rescue inhaler;  She will f/u w/ DrByrum in 6wks for recheck...  ROV 07/10/15 -- follow-up visit. I have not seen her since 2014. I have followed her for obstructive lung disease documented on pulmonary function testing in 2010. She also has a history of COP diagnosed with right upper lobe surgery in 2007. She has been managed on Symbicort and albuterol as needed. She was seen by Dr. Lenna Gilford in July 2016 in the setting of wheezing, chest tightness sputum consistent with an exacerbation of COPD. She has improved now after treatment with levofloxacin and Medrol.  She tells me that she is feeling better but may not be back to full strength yet. She uses ventolin a few times a week. She reports that she has had 2 other flares besides the one w Dr Lenna Gilford.   ROV 08/07/15 -- follow-up visit for obstructive lung disease and a history of COP with prior right upper lobe biopsy in 2007. She follows up today after pulmonary function testing. I have reviewed the results personally pulmonary and these show moderate obstructive lung disease without bronchodilator response, evidence of restriction based on a decreased residual volume and a normal diffusion capacity. Her FEV1 has decreased slightly compared with 2010, currently 75% of predicted compared with 82% of predicted. 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 Her pulmonary function testing from 08/09/15 as detailed above. At that time we did a trial of adding Spiriva to her Symbicort to see if she would benefit. She didn't notice any improvement and she didn;t like the powder. She believes her overall breathing is better, she may have less dyspnea. She is not doing much exercise.  No real cough. Occasionally  has some wheeze.   /04/15/2016 Acute OV  Pt presents for an acute office visit.  Complains of  1 week of wheezing, sob, chest tightness , productive cough, congestion with thick yellow mucus. Felt like low grade fever, .Taking OTC cold meds. . Has used her SABA more last few days.  Remains on Symbicort.  Leaving to go on vacation in few days. Wants to get well enough to go.  Good appetite with no n/v/d.  Denies chest pain , orthopnea, edema or fever.    ROS  Constitutional:   No  weight loss, night sweats,  Fevers, chills, fatigue, or  lassitude.  HEENT:   No headaches,  Difficulty swallowing,  Tooth/dental problems, or  Sore throat,                No sneezing, itching, ear ache,  +nasal congestion, post nasal drip,   CV:  No chest pain,  Orthopnea, PND, swelling in lower extremities, anasarca, dizziness, palpitations, syncope.   GI  No heartburn, indigestion, abdominal pain, nausea, vomiting, diarrhea, change in bowel habits, loss of appetite, bloody stools.   Resp:    No chest wall deformity  Skin: no rash or lesions.  GU: no dysuria, change in color of urine, no urgency or frequency.  No flank pain, no hematuria   MS:  No joint pain or swelling.  No decreased range of motion.  No back pain.  Psych:  No change in mood or affect. No depression or anxiety.  No memory loss.     Past Medical History  Diagnosis Date  . Other acute sinusitis   . Other diseases of nasal cavity and sinuses(478.19)   . Pure hypercholesterolemia   . Unspecified asthma(493.90)    Current Outpatient Prescriptions on File Prior to Visit  Medication Sig Dispense Refill  . albuterol (PROAIR HFA) 108 (90 BASE) MCG/ACT inhaler Inhale 2 puffs into the lungs 4 (four) times daily as needed.      . Alendronate Sodium (FOSAMAX PO) Take by mouth. TAKE ONE TABLET ONCE WEEKLY    . ALPRAZolam (XANAX XR) 0.5 MG 24 hr tablet Take 0.5 mg by mouth as needed for anxiety.    Marland Kitchen aspirin 81 MG tablet Take 81 mg by mouth  daily.      Marland Kitchen atorvastatin (LIPITOR) 10 MG tablet Take 10 mg by mouth daily.    Marland Kitchen azelastine (ASTEPRO) 137 MCG/SPRAY nasal spray Place 1 spray into the nose 2 (two) times daily as needed.     . brinzolamide (AZOPT) 1 % ophthalmic suspension Place 1 drop into both eyes 2 (two) times daily.    . budesonide-formoterol (SYMBICORT) 160-4.5 MCG/ACT inhaler Inhale 2 puffs into the lungs 2 (two) times daily.      . calcium-vitamin D (OSCAL) 250-125 MG-UNIT per tablet Take 1 tablet by mouth daily.      . Grape Seed OIL Take 1 capsule by mouth daily.     Marland Kitchen  mometasone (NASONEX) 50 MCG/ACT nasal spray Place 2 sprays into the nose daily as needed.     . Omega-3 Fatty Acids (FISH OIL) 1000 MG CAPS Take 1 capsule by mouth daily.      . pantoprazole (PROTONIX) 40 MG tablet Take 40 mg by mouth daily.      Marland Kitchen SYNTHROID 50 MCG tablet TAKE 1 TABLET BY MOUTH ONCE EVERY MORNING ON AN EMPTY STOMACH  5  . traMADol (ULTRAM) 50 MG tablet Take by mouth every 6 (six) hours as needed.    . traZODone (DESYREL) 100 MG tablet Take 100 mg by mouth at bedtime.  1  . Vitamin D, Cholecalciferol, 400 UNITS CHEW Chew 1 tablet by mouth daily.     . Vitamin D, Ergocalciferol, (DRISDOL) 50000 UNITS CAPS capsule Take 50,000 Units by mouth every 7 (seven) days.    Marland Kitchen rOPINIRole (REQUIP) 1 MG tablet Take 1 mg by mouth at bedtime.     No current facility-administered medications on file prior to visit.     Objective:   Physical Exam   Filed Vitals:   04/15/16 1408  BP: 138/80  Pulse: 83  Temp: 98.5 F (36.9 C)  TempSrc: Oral  Height: 5' 6.5" (1.689 m)  Weight: 171 lb 6.4 oz (77.747 kg)  SpO2: 97%    GEN: A/Ox3; pleasant , NAD, well nourished   HEENT:  [place]/AT,  EACs-clear, TMs-wnl, NOSE-clear, THROAT-clear, no lesions, no postnasal drip or exudate noted.   NECK:  Supple w/ fair ROM; no JVD; normal carotid impulses w/o bruits; no thyromegaly or nodules palpated; no lymphadenopathy.  RESP  Clear  P & A; w/o, wheezes/ rales/  or rhonchi.no accessory muscle use, no dullness to percussion  CARD:  RRR, no m/r/g  , no peripheral edema, pulses intact, no cyanosis or clubbing.  GI:   Soft & nt; nml bowel sounds; no organomegaly or masses detected.  Musco: Warm bil, no deformities or joint swelling noted.   Neuro: alert, no focal deficits noted.    Skin: Warm, no lesions or rashes   Tammy Parrett NP-C  [facility]  04/15/2016

## 2016-04-15 NOTE — Patient Instructions (Signed)
Zpack take as directed.  Mucinex DM Twice daily  As needed  Cough/cognestion  Prednisone taper over next week.  Fluids and rest  Please contact office for sooner follow up if symptoms do not improve or worsen or seek emergency care

## 2016-04-15 NOTE — Assessment & Plan Note (Signed)
Flare with URI   Plan  Zpack take as directed.  Mucinex DM Twice daily  As needed  Cough/cognestion  Prednisone taper over next week.  Fluids and rest  Please contact office for sooner follow up if symptoms do not improve or worsen or seek emergency care

## 2016-04-30 ENCOUNTER — Other Ambulatory Visit: Payer: Self-pay | Admitting: Family Medicine

## 2016-04-30 DIAGNOSIS — Z1231 Encounter for screening mammogram for malignant neoplasm of breast: Secondary | ICD-10-CM

## 2016-05-16 ENCOUNTER — Ambulatory Visit
Admission: RE | Admit: 2016-05-16 | Discharge: 2016-05-16 | Disposition: A | Discharge status: Home / Self Care | Payer: Medicare Other | Source: Ambulatory Visit | Attending: Family Medicine | Admitting: Family Medicine

## 2016-05-16 DIAGNOSIS — Z1231 Encounter for screening mammogram for malignant neoplasm of breast: Secondary | ICD-10-CM

## 2016-09-15 ENCOUNTER — Encounter: Payer: Self-pay | Admitting: Emergency Medicine

## 2016-09-15 ENCOUNTER — Ambulatory Visit (INDEPENDENT_AMBULATORY_CARE_PROVIDER_SITE_OTHER): Payer: Medicare Other | Admitting: Emergency Medicine

## 2016-09-15 DIAGNOSIS — J301 Allergic rhinitis due to pollen: Secondary | ICD-10-CM | POA: Diagnosis not present

## 2016-09-15 DIAGNOSIS — J441 Chronic obstructive pulmonary disease with (acute) exacerbation: Secondary | ICD-10-CM | POA: Diagnosis not present

## 2016-09-15 DIAGNOSIS — J8489 Other specified interstitial pulmonary diseases: Secondary | ICD-10-CM

## 2016-09-15 NOTE — Assessment & Plan Note (Signed)
Keep astepro and nasonex available to use as needed.

## 2016-09-15 NOTE — Assessment & Plan Note (Signed)
  We will repeat your CXR at next visit.  Follow with Dr Lamonte Sakai in 12 months or sooner if you have any problems

## 2016-09-15 NOTE — Progress Notes (Signed)
Subjective:     Patient ID: [name], female   DOB: August 12, 1941, 75 y.o.   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  She was last seen here Feb '14 for dyspnea, cough, CP that she first noticed in January climbing stairs. She was treated with pred taper, continued on Symbicort. Returns now to report that her breathing is better, chest discomfort is better but still happens sometimes w exertion. Her cough is resolved. She uses albuterol a few times a month.  She is on nasonex, astepro prn.   ROV 09/07/13 w/ RB-- 75 yo hx moderate obstruction, COP dx by bx in '07, allergies.  She still has exertional dyspnea with stairs or chores. She has backed off on nasal steroid, astepro. No flares. Tolerates symbicort, uses SABA about 2x a week.   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 Presents w/ a 2wk hx incr SOB, cough w/ yellow sput, chest tightness & wheezing;  It all started when she was in [facility] on vacation, said she was hot, sweaty, AC went out, she got weak, cough, sput, but denies f/c or hemoptysis, no CP... She notes her last antibiotic was ~36mo ago from her PCP DrCSmith...  She was supposed to get PFT & 47minWT in 2013 but never did it...  Last CXR was 2/14 showing norm heart size, chronic changes w/ scarring right base, scoliosis, NAD.Marland Kitchen EXAM shows Afeb, VSS, O2sat= 96% on RA;  HEENT- neg, mallampati2;  Chest- few scat rhonchi at bases R>L, no wheezing, rales, signs of consolidation;  Heart- RR, w/o m/r/g;  Abd- soft, neg;  Ext- neg w/o c/c/e... IMP/PLAN>>  Rhen appears to have a COPD exac, not toxic/ hypoxemic/ few rhonchi on exam;  Rec to take  LEVAQUIN500mg x7d, Align, and MEDROL Dosepak; continue Symbicort160-2spBid & prn AlbutHFA rescue inhaler;  She will f/u w/ DrByrum in 6wks for recheck...  ROV 07/10/15 -- follow-up visit. I have not seen her since 2014. I have followed her for obstructive lung disease documented on pulmonary function testing in 2010. She also has a history of COP diagnosed with right upper lobe surgery in 2007. She has been managed on Symbicort and albuterol as needed. She was seen by Dr. Lenna Gilford in July 2016 in the setting of wheezing, chest tightness sputum consistent with an exacerbation of COPD. She has improved now after treatment with levofloxacin and Medrol.  She tells me that she is feeling better but may not be back to full strength yet. She uses ventolin a few times a week. She reports that she has had 2 other flares besides the one w Dr Lenna Gilford.   ROV 08/07/15 -- follow-up visit for obstructive lung disease and a history of COP with prior right upper lobe biopsy in 2007. She follows up today after pulmonary function testing. I have reviewed the results personally pulmonary and these show moderate obstructive lung disease without bronchodilator response, evidence of restriction based on a decreased residual volume and a normal diffusion capacity. Her FEV1 has decreased slightly compared with 2010, currently 75% of predicted compared with 82% of predicted. 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 Her pulmonary function testing from 08/09/15 as detailed above. At that time we did a trial of adding Spiriva to her Symbicort to see if she would benefit. She didn't notice any improvement and she didn;t like the powder. She believes her overall breathing is better, she may have less dyspnea. She is not doing much exercise.  No real cough. Occasionally  has some wheeze.   ROV 09/15/16 -- patient has a history of cryptogenic organizing pneumonia seen on right upper lobe biopsy in 2007, obstructive lung disease/asthma. She was last seen in our office for an acute exacerbation 04/15/16. She feels back to baseline now. Currently using Symbicort. Rare albuterol use. No more flares since June. She denies cough, dyspnea. Allergies are well controlled right now, has nasonex and astepro prn. Flu shot up to date.    Past Medical History:  Diagnosis Date  . Other acute sinusitis   . Other diseases of nasal cavity and sinuses(478.19)   . Pure hypercholesterolemia   . Unspecified asthma(493.90)     Past Surgical History:  Procedure Laterality Date  . CORNEAL TRANSPLANT    . 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 ROTATOR CUFF REPAIR     bilateral  . TONSILLECTOMY     twice    Current Outpatient Prescriptions:  .  albuterol (PROAIR HFA) 108 (90 BASE) MCG/ACT inhaler, Inhale 2 puffs into the lungs 4 (four) times daily as needed.  , Disp: , Rfl:  .  Alendronate Sodium (FOSAMAX PO), Take by mouth. TAKE ONE TABLET ONCE WEEKLY, Disp: , Rfl:  .  ALPRAZolam (XANAX XR) 0.5 MG 24 hr tablet, Take 0.5 mg by mouth as needed for anxiety., Disp: , Rfl:  .  aspirin 81 MG tablet, Take 81 mg by mouth daily.  , Disp: , Rfl:  .  atorvastatin (LIPITOR) 10 MG tablet, Take 10 mg by mouth daily., Disp: , Rfl:  .  azelastine (ASTEPRO) 137 MCG/SPRAY nasal spray, Place 1 spray into the nose 2 (two) times daily as needed. , Disp: , Rfl:  .  brinzolamide (AZOPT) 1 % ophthalmic suspension, Place 1 drop into both eyes 2 (two) times daily., Disp: , Rfl:  .  budesonide-formoterol (SYMBICORT) 160-4.5 MCG/ACT inhaler, Inhale 2 puffs into the lungs 2 (two) times daily.  , Disp: , Rfl:  .  calcium-vitamin D (OSCAL) 250-125 MG-UNIT per tablet, Take 1 tablet by mouth daily.  , Disp: , Rfl:  .  Grape Seed OIL, Take 1 capsule by mouth daily. , Disp: , Rfl:  .  mometasone (NASONEX) 50  MCG/ACT nasal spray, Place 2 sprays into the nose daily as needed. , Disp: , Rfl:  .  Omega-3 Fatty Acids (FISH OIL) 1000 MG CAPS, Take 1 capsule by mouth daily.  , Disp: , Rfl:  .  pantoprazole (PROTONIX) 40 MG tablet, Take 40 mg by mouth daily.  , Disp: , Rfl:  .  rOPINIRole (REQUIP) 1 MG tablet, Take 1 mg by mouth at bedtime., Disp: , Rfl:  .  SYNTHROID 50 MCG tablet, TAKE 1 TABLET BY MOUTH ONCE EVERY MORNING ON AN EMPTY STOMACH, Disp: , Rfl: 5 .  traZODone (DESYREL) 100 MG tablet, Take 100 mg by mouth at bedtime., Disp: , Rfl: 1 .  Vitamin D, Cholecalciferol, 400  UNITS CHEW, Chew 1 tablet by mouth daily. , Disp: , Rfl:  .  Vitamin D, Ergocalciferol, (DRISDOL) 50000 UNITS CAPS capsule, Take 50,000 Units by mouth every 7 (seven) days., Disp: , Rfl:    Review of Systems As per HPI   Objective:   Physical Exam Vitals:   09/15/16 1145  BP: 124/68  Pulse: 90  SpO2: 97%  Weight: 179 lb (81.2 kg)  Height: 5' 6.5" (1.689 m)   Gen: Pleasant, well-nourished, in no distress,  normal affect  ENT: No lesions,  mouth clear,  oropharynx clear, no postnasal drip  Neck: No JVD, no TMG, no carotid bruits  Lungs: No use of accessory muscles, clear without rales or rhonchi  Cardiovascular: RRR, heart sounds normal, no murmur or gallops, no peripheral edema  Musculoskeletal: No deformities, no cyanosis or clubbing  Neuro: alert, non focal  Skin: Warm, no lesions or rashes   Assessment:     COPD (chronic obstructive pulmonary disease) Please continue your Symbicort twice a day Take albuterol (Ventolin) 2 puffs up to every 4 hours if needed for shortness of breath.    BOOP (bronchiolitis obliterans with organizing pneumonia)  We will repeat your CXR at next visit.  Follow with Dr Lamonte Sakai in 12 months or sooner if you have any problems  Chronic rhinitis Keep astepro and nasonex available to use as needed.  Baltazar Apo, MD, PhD 09/15/2016, 12:12 PM [facility] [phone] or if no answer [phone]

## 2016-09-15 NOTE — Patient Instructions (Signed)
Please continue your Symbicort twice a day Take albuterol (Ventolin) 2 puffs up to every 4 hours if needed for shortness of breath.  Keep astepro and nasonex available to use as needed.  We will repeat your CXR at next visit.  Follow with Dr Lamonte Sakai in 12 months or sooner if you have any problems

## 2016-09-15 NOTE — Assessment & Plan Note (Signed)
Please continue your Symbicort twice a day Take albuterol (Ventolin) 2 puffs up to every 4 hours if needed for shortness of breath.

## 2016-11-13 DIAGNOSIS — R05 Cough: Secondary | ICD-10-CM | POA: Diagnosis not present

## 2016-11-13 DIAGNOSIS — R062 Wheezing: Secondary | ICD-10-CM | POA: Diagnosis not present

## 2016-11-13 DIAGNOSIS — J0100 Acute maxillary sinusitis, unspecified: Secondary | ICD-10-CM | POA: Diagnosis not present

## 2016-11-24 DIAGNOSIS — H401122 Primary open-angle glaucoma, left eye, moderate stage: Secondary | ICD-10-CM | POA: Insufficient documentation

## 2016-11-24 DIAGNOSIS — H401112 Primary open-angle glaucoma, right eye, moderate stage: Secondary | ICD-10-CM | POA: Insufficient documentation

## 2016-12-03 DIAGNOSIS — H401132 Primary open-angle glaucoma, bilateral, moderate stage: Secondary | ICD-10-CM | POA: Diagnosis not present

## 2017-01-29 ENCOUNTER — Ambulatory Visit (INDEPENDENT_AMBULATORY_CARE_PROVIDER_SITE_OTHER): Payer: Medicare HMO | Admitting: Pulmonary Disease

## 2017-01-29 ENCOUNTER — Ambulatory Visit (INDEPENDENT_AMBULATORY_CARE_PROVIDER_SITE_OTHER)
Admission: RE | Admit: 2017-01-29 | Discharge: 2017-01-29 | Disposition: A | Discharge status: Home / Self Care | Payer: Medicare HMO | Source: Ambulatory Visit | Attending: Pulmonary Disease | Admitting: Pulmonary Disease

## 2017-01-29 ENCOUNTER — Encounter: Payer: Self-pay | Admitting: Pulmonary Disease

## 2017-01-29 VITALS — BP 130/70 | HR 82 | Ht 66.5 in | Wt 181.2 lb

## 2017-01-29 DIAGNOSIS — J209 Acute bronchitis, unspecified: Secondary | ICD-10-CM | POA: Insufficient documentation

## 2017-01-29 DIAGNOSIS — J8489 Other specified interstitial pulmonary diseases: Secondary | ICD-10-CM

## 2017-01-29 DIAGNOSIS — R05 Cough: Secondary | ICD-10-CM | POA: Diagnosis not present

## 2017-01-29 MED ORDER — AMOXICILLIN-POT CLAVULANATE 875-125 MG PO TABS: 1.0000 | ORAL_TABLET | Freq: Two times a day (BID) | ORAL | 0 refills | Status: DC

## 2017-01-29 MED ORDER — PREDNISONE 10 MG PO TABS
ORAL_TABLET | ORAL | 0 refills | Status: DC
Start: 2017-01-29 — End: 2017-09-23

## 2017-01-29 NOTE — Progress Notes (Signed)
   Subjective:    Patient ID: [name], female    DOB: 1941/08/07, 76 y.o.   MRN: [id number]  HPI  76 year old never smoker with a history of cryptogenic organizing pneumonia s/p right upper lobe lobectomy in 2007, obstructive lung disease/asthma  [place] got exposed to the cold in the bed and shortly after developed a cough productive of green sputum She reports some pressure in her chest and increased wheezing requiring increasing use of her albuterol inhaler. She is compliant with her Symbicort. Denies fevers chills or burning micturition  Past Medical History:  Diagnosis Date  . Other acute sinusitis   . Other diseases of nasal cavity and sinuses(478.19)   . Pure hypercholesterolemia   . Unspecified asthma(493.90)      Review of Systems neg for any significant sore throat, dysphagia, itching, sneezing, nasal congestion or excess/ purulent secretions, fever, chills, sweats, unintended wt loss, pleuritic or exertional cp, hempoptysis, orthopnea pnd or change in chronic leg swelling.   Also denies presyncope, palpitations, heartburn, abdominal pain, nausea, vomiting, diarrhea or change in bowel or urinary habits, dysuria,hematuria, rash, arthralgias, visual complaints, headache, numbness weakness or ataxia.     Objective:   Physical Exam  Gen. Pleasant, well-nourished, in no distress ENT - no lesions, no post nasal drip Neck: No JVD, no thyromegaly, no carotid bruits Lungs: no use of accessory muscles, no dullness to percussion, clear without rales or rhonchi  Cardiovascular: Rhythm regular, heart sounds  normal, no murmurs or gallops, no peripheral edema Musculoskeletal: No deformities, no cyanosis or clubbing        Assessment & Plan:

## 2017-01-29 NOTE — Assessment & Plan Note (Signed)
We'll treat her as an acute bronchitis Given her prior history of BOOP, reasonable to obtain chest x-ray today  Augmentin 875 mg twice daily for 7 days. Prednisone 10 mg tabs  Take 2 tabs daily with food x 5ds, then 1 tab daily with food x 5ds then STOP

## 2017-01-29 NOTE — Assessment & Plan Note (Signed)
Ct symbicort 

## 2017-01-29 NOTE — Patient Instructions (Signed)
Chest x-ray today. Augmentin 875 mg twice daily for 7 days. Prednisone 10 mg tabs  Take 2 tabs daily with food x 5ds, then 1 tab daily with food x 5ds then STOP

## 2017-01-30 ENCOUNTER — Telehealth: Payer: Self-pay | Admitting: Pulmonary Disease

## 2017-01-30 NOTE — Telephone Encounter (Signed)
Notes recorded by Rigoberto Noel, MD on 01/29/2017 at 4:50 PM EDT No evidence of pneumonia   Called and spoke to pt. Informed her of the results per RA. Pt verbalized understanding and denied any further questions or concerns at this time.

## 2017-01-30 NOTE — Progress Notes (Signed)
Called and spoke to pt. Informed her of the results per RA. Pt verbalized understanding and denied any further questions or concerns at this time.

## 2017-02-23 DIAGNOSIS — R944 Abnormal results of kidney function studies: Secondary | ICD-10-CM | POA: Diagnosis not present

## 2017-02-23 DIAGNOSIS — E039 Hypothyroidism, unspecified: Secondary | ICD-10-CM | POA: Diagnosis not present

## 2017-02-23 DIAGNOSIS — G2581 Restless legs syndrome: Secondary | ICD-10-CM | POA: Diagnosis not present

## 2017-02-23 DIAGNOSIS — G4700 Insomnia, unspecified: Secondary | ICD-10-CM | POA: Diagnosis not present

## 2017-02-23 DIAGNOSIS — K219 Gastro-esophageal reflux disease without esophagitis: Secondary | ICD-10-CM | POA: Diagnosis not present

## 2017-02-23 DIAGNOSIS — J8489 Other specified interstitial pulmonary diseases: Secondary | ICD-10-CM | POA: Diagnosis not present

## 2017-02-23 DIAGNOSIS — J4520 Mild intermittent asthma, uncomplicated: Secondary | ICD-10-CM | POA: Diagnosis not present

## 2017-02-23 DIAGNOSIS — E7800 Pure hypercholesterolemia, unspecified: Secondary | ICD-10-CM | POA: Diagnosis not present

## 2017-03-25 IMAGING — CR DG CHEST 2V
2 series · 2 of 2 positions shown · non-contrast
Comparison: Chest radiograph 12/28/2012.

CLINICAL DATA: History of asthma.

EXAM:
CHEST  2 VIEW

[view not recorded (1 of 2)]
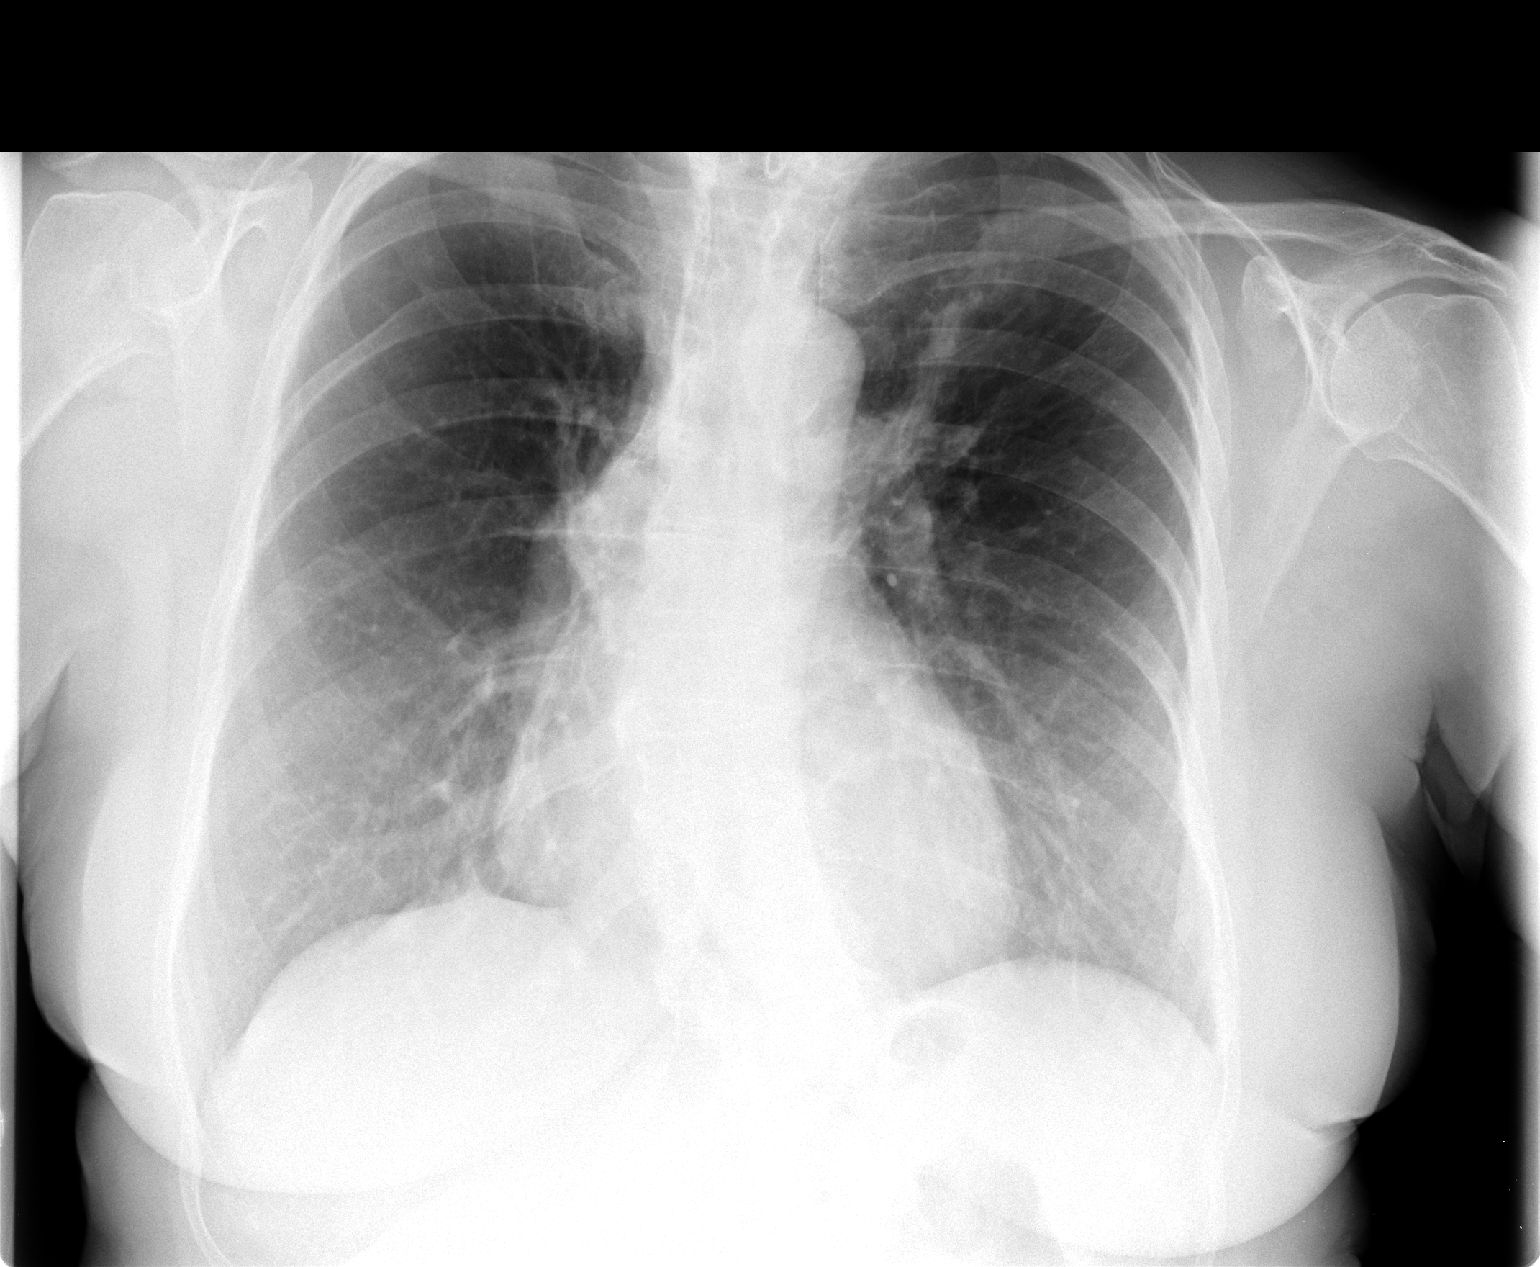

[view not recorded (2 of 2)]
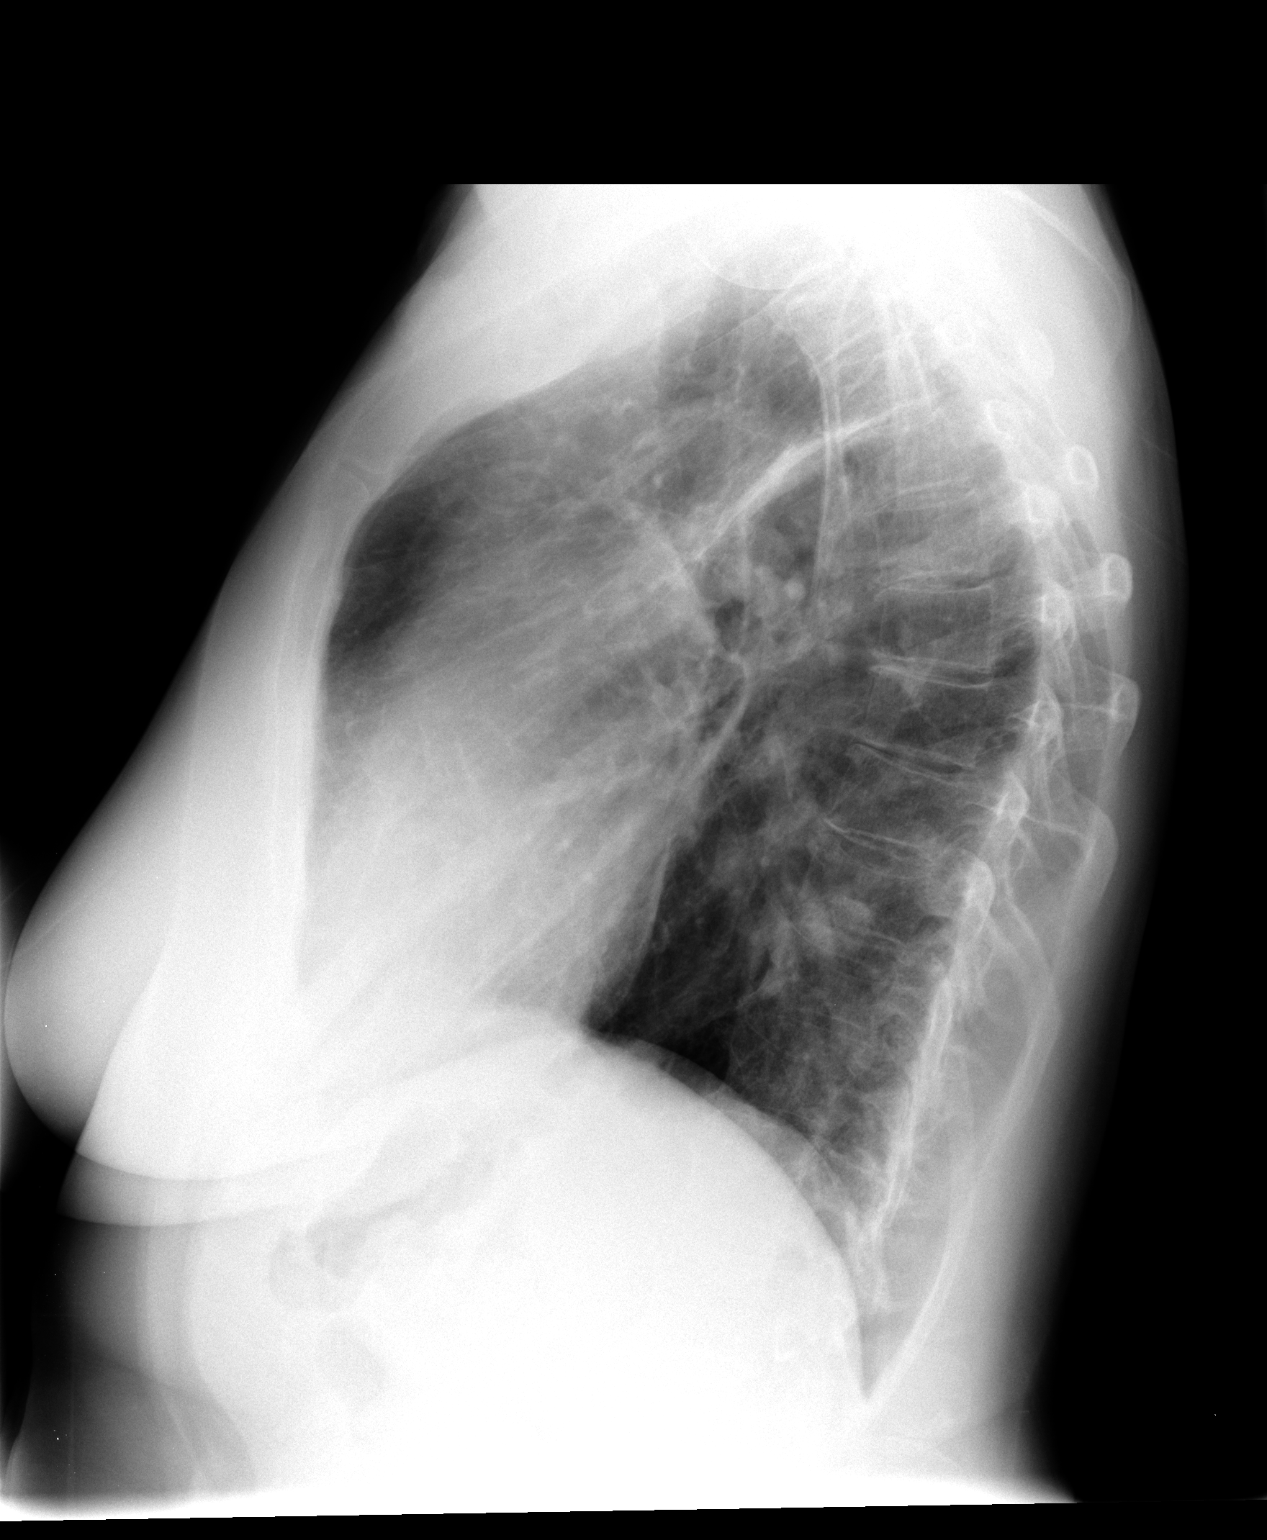

[2 of 2 positions shown; findings below may reference images not displayed]

FINDINGS: Stable cardiac and mediastinal contours. Stable scarring within the
right lung base. No superimposed acute pulmonary opacities. No
pleural effusion or pneumothorax. Regional skeleton is unremarkable.
IMPRESSION: Stable chronic changes.  No acute cardiopulmonary process.

## 2017-05-06 DIAGNOSIS — H401132 Primary open-angle glaucoma, bilateral, moderate stage: Secondary | ICD-10-CM | POA: Diagnosis not present

## 2017-06-26 DIAGNOSIS — H401132 Primary open-angle glaucoma, bilateral, moderate stage: Secondary | ICD-10-CM | POA: Diagnosis not present

## 2017-07-03 ENCOUNTER — Other Ambulatory Visit: Payer: Self-pay | Admitting: Family Medicine

## 2017-07-03 DIAGNOSIS — Z1231 Encounter for screening mammogram for malignant neoplasm of breast: Secondary | ICD-10-CM

## 2017-07-22 ENCOUNTER — Ambulatory Visit
Admission: RE | Admit: 2017-07-22 | Discharge: 2017-07-22 | Disposition: A | Discharge status: Home / Self Care | Payer: Medicare HMO | Source: Ambulatory Visit | Attending: Family Medicine | Admitting: Family Medicine

## 2017-07-22 DIAGNOSIS — Z1231 Encounter for screening mammogram for malignant neoplasm of breast: Secondary | ICD-10-CM | POA: Diagnosis not present

## 2017-07-29 DIAGNOSIS — B029 Zoster without complications: Secondary | ICD-10-CM | POA: Diagnosis not present

## 2017-07-29 DIAGNOSIS — L0390 Cellulitis, unspecified: Secondary | ICD-10-CM | POA: Diagnosis not present

## 2017-07-29 DIAGNOSIS — R51 Headache: Secondary | ICD-10-CM | POA: Diagnosis not present

## 2017-08-27 DIAGNOSIS — M25562 Pain in left knee: Secondary | ICD-10-CM | POA: Diagnosis not present

## 2017-08-27 DIAGNOSIS — Z0001 Encounter for general adult medical examination with abnormal findings: Secondary | ICD-10-CM | POA: Diagnosis not present

## 2017-08-27 DIAGNOSIS — E039 Hypothyroidism, unspecified: Secondary | ICD-10-CM | POA: Diagnosis not present

## 2017-08-27 DIAGNOSIS — J4520 Mild intermittent asthma, uncomplicated: Secondary | ICD-10-CM | POA: Diagnosis not present

## 2017-08-27 DIAGNOSIS — K219 Gastro-esophageal reflux disease without esophagitis: Secondary | ICD-10-CM | POA: Diagnosis not present

## 2017-08-27 DIAGNOSIS — Z23 Encounter for immunization: Secondary | ICD-10-CM | POA: Diagnosis not present

## 2017-08-27 DIAGNOSIS — J8489 Other specified interstitial pulmonary diseases: Secondary | ICD-10-CM | POA: Diagnosis not present

## 2017-08-27 DIAGNOSIS — Z1389 Encounter for screening for other disorder: Secondary | ICD-10-CM | POA: Diagnosis not present

## 2017-08-27 DIAGNOSIS — E559 Vitamin D deficiency, unspecified: Secondary | ICD-10-CM | POA: Diagnosis not present

## 2017-08-27 DIAGNOSIS — E7800 Pure hypercholesterolemia, unspecified: Secondary | ICD-10-CM | POA: Diagnosis not present

## 2017-08-27 DIAGNOSIS — G2581 Restless legs syndrome: Secondary | ICD-10-CM | POA: Diagnosis not present

## 2017-09-16 ENCOUNTER — Ambulatory Visit: Payer: Medicare HMO | Admitting: Emergency Medicine

## 2017-09-23 ENCOUNTER — Ambulatory Visit: Payer: Medicare HMO | Admitting: Emergency Medicine

## 2017-09-23 ENCOUNTER — Encounter: Payer: Self-pay | Admitting: Emergency Medicine

## 2017-09-23 DIAGNOSIS — J441 Chronic obstructive pulmonary disease with (acute) exacerbation: Secondary | ICD-10-CM

## 2017-09-23 NOTE — Patient Instructions (Addendum)
Please continue your Symbicort as you have been taking it Keep albuterol available to use as needed for shortness of breath Flu shot up to date.  We discussed your lung disease and the value of designating a [place] today. This person should know your wishes regarding your care should you be unable to speak for yourself.  Follow with Dr Lamonte Sakai in 12 months or sooner if you have any problems

## 2017-09-23 NOTE — Assessment & Plan Note (Signed)
I spent 20 minutes of 30-minute visit discussing goals of care, the severity of her lung disease, the potential scenarios for mechanical ventilation and critical care support should she become critically ill.  I have advised her that although she does have moderate obstruction she certainly does not have end-stage lung disease.  Therefore I have recommended that any mechanical ventilation or ICU support be judged based on the advice of her treating physicians with regard to whether she can make a meaningful recovery.  If so then such support would be reasonable.  If there is no reasonable chance for a recovery then I would advocate either avoiding mechanical ventilation or withdrawal of such care.  She understands that advice.  She is going to get a healthcare power of attorney and make her wishes known.  Please continue your Symbicort as you have been taking it Keep albuterol available to use as needed for shortness of breath Flu shot up to date.  We discussed your lung disease and the value of designating a [place] today. This person should know your wishes regarding your care should you be unable to speak for yourself.  Follow with Dr Lamonte Sakai in 12 months or sooner if you have any problems

## 2017-09-23 NOTE — Progress Notes (Signed)
Subjective:     Patient ID: [name], female   DOB: [birth year], 76 y.o.   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 She was last seen in our office for an acute exacerbation 04/15/16. She feels back to baseline now. Currently using Symbicort. Rare albuterol use. No more flares since June. She denies cough, dyspnea. Allergies are well controlled right now, has nasonex and astepro prn. Flu shot up to date.   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  She has obstructive lung disease/asthma with moderate obstruction noted on pulmonary function testing from 2016. She has had 2 flares this year, treated with prednisone. Those flares consisted of cough, sore throat, dyspnea, wheeze. She is reliable with Symbicort, still believes that she benefits from it. She uses albuterol occasionally, once every couple weeks. She can get more SOB when she works in the yard. She has nasonex, asteline to use prn. She remains active, is able to shop, exert herself. We spent time today talking about advanced directives, goals of care, the severity of her lung disease.     Past Medical History:  Diagnosis Date  . Other acute sinusitis   . Other diseases of nasal cavity and sinuses(478.19)   . Pure hypercholesterolemia   . Unspecified asthma(493.90)     Past Surgical History:  Procedure Laterality Date  . CORNEAL TRANSPLANT    . 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 ROTATOR CUFF REPAIR     bilateral  . TONSILLECTOMY     twice    Current Outpatient Medications:  .  albuterol (PROAIR HFA) 108 (90 BASE) MCG/ACT inhaler, Inhale 2 puffs into the lungs 4 (four) times daily as needed.  , Disp: , Rfl:  .  Alendronate Sodium (FOSAMAX PO), Take by mouth. TAKE ONE TABLET ONCE WEEKLY, Disp: , Rfl:  .   ALPRAZolam (XANAX XR) 0.5 MG 24 hr tablet, Take 0.5 mg by mouth as needed for anxiety., Disp: , Rfl:  .  aspirin 81 MG tablet, Take 81 mg by mouth daily.  , Disp: , Rfl:  .  atorvastatin (LIPITOR) 10 MG tablet, Take 10 mg by mouth daily., Disp: , Rfl:  .  azelastine (ASTEPRO) 137 MCG/SPRAY nasal spray, Place 1 spray into the nose 2 (two) times daily as needed. , Disp: , Rfl:  .  brinzolamide (AZOPT) 1 % ophthalmic suspension, Place 1 drop into both eyes 2 (two) times daily., Disp: , Rfl:  .  budesonide-formoterol (SYMBICORT) 160-4.5 MCG/ACT inhaler, Inhale 2 puffs into the lungs 2 (two) times daily.  , Disp: , Rfl:  .  calcium-vitamin D (OSCAL) 250-125 MG-UNIT per tablet, Take 1 tablet by mouth daily.  , Disp: , Rfl:  .  mometasone (NASONEX) 50 MCG/ACT nasal spray, Place 2 sprays into the nose daily as needed. , Disp: , Rfl:  .  Omega-3 Fatty Acids (FISH OIL) 1000 MG CAPS, Take 1 capsule by mouth daily.  , Disp: , Rfl:  .  pantoprazole (PROTONIX) 40 MG tablet, Take 40 mg by mouth daily.  , Disp: , Rfl:  .  rOPINIRole (REQUIP) 1 MG tablet, Take 1 mg by mouth at bedtime., Disp: , Rfl:  .  SYNTHROID 50 MCG tablet, TAKE 1 TABLET BY MOUTH ONCE EVERY MORNING ON AN EMPTY STOMACH, Disp: ,  Rfl: 5 .  traZODone (DESYREL) 100 MG tablet, Take 100 mg by mouth at bedtime., Disp: , Rfl: 1 .  Vitamin D, Cholecalciferol, 400 UNITS CHEW, Chew 1 tablet by mouth daily. , Disp: , Rfl:  .  Vitamin D, Ergocalciferol, (DRISDOL) 50000 UNITS CAPS capsule, Take 50,000 Units by mouth every 7 (seven) days., Disp: , Rfl:    Review of Systems As per HPI   Objective:   Physical Exam Vitals:   09/23/17 1418  BP: 124/78  Pulse: 100  SpO2: 97%  Weight: 180 lb (81.6 kg)  Height: 5' 6.5" (1.689 m)    Gen: Pleasant, well-nourished, in no distress,  normal affect  ENT: No lesions,  mouth clear,  oropharynx clear, no postnasal drip  Neck: No JVD, no TMG, no carotid bruits  Lungs: No use of accessory muscles, clear  without rales or rhonchi  Cardiovascular: RRR, heart sounds normal, no murmur or gallops, no peripheral edema  Musculoskeletal: No deformities, no cyanosis or clubbing  Neuro: alert, non focal  Skin: Warm, no lesions or rashes   Assessment:     COPD (chronic obstructive pulmonary disease) I spent 20 minutes of 30-minute visit discussing goals of care, the severity of her lung disease, the potential scenarios for mechanical ventilation and critical care support should she become critically ill.  I have advised her that although she does have moderate obstruction she certainly does not have end-stage lung disease.  Therefore I have recommended that any mechanical ventilation or ICU support be judged based on the advice of her treating physicians with regard to whether she can make a meaningful recovery.  If so then such support would be reasonable.  If there is no reasonable chance for a recovery then I would advocate either avoiding mechanical ventilation or withdrawal of such care.  She understands that advice.  She is going to get a healthcare power of attorney and make her wishes known.  Please continue your Symbicort as you have been taking it Keep albuterol available to use as needed for shortness of breath Flu shot up to date.  We discussed your lung disease and the value of designating a [place] today. This person should know your wishes regarding your care should you be unable to speak for yourself.  Follow with Dr Lamonte Sakai in 12 months or sooner if you have any problems  Baltazar Apo, MD, PhD 09/23/2017, 2:45 PM [facility] [phone] or if no answer [phone]

## 2017-11-16 DIAGNOSIS — H401112 Primary open-angle glaucoma, right eye, moderate stage: Secondary | ICD-10-CM | POA: Diagnosis not present

## 2017-11-16 DIAGNOSIS — H401122 Primary open-angle glaucoma, left eye, moderate stage: Secondary | ICD-10-CM | POA: Diagnosis not present

## 2017-11-16 DIAGNOSIS — H401132 Primary open-angle glaucoma, bilateral, moderate stage: Secondary | ICD-10-CM | POA: Diagnosis not present

## 2017-11-19 DIAGNOSIS — M25562 Pain in left knee: Secondary | ICD-10-CM | POA: Diagnosis not present

## 2017-11-19 DIAGNOSIS — M1712 Unilateral primary osteoarthritis, left knee: Secondary | ICD-10-CM | POA: Diagnosis not present

## 2018-02-11 DIAGNOSIS — L03211 Cellulitis of face: Secondary | ICD-10-CM | POA: Diagnosis not present

## 2018-02-11 DIAGNOSIS — B029 Zoster without complications: Secondary | ICD-10-CM | POA: Diagnosis not present

## 2018-02-15 DIAGNOSIS — H401122 Primary open-angle glaucoma, left eye, moderate stage: Secondary | ICD-10-CM | POA: Diagnosis not present

## 2018-02-15 DIAGNOSIS — H401112 Primary open-angle glaucoma, right eye, moderate stage: Secondary | ICD-10-CM | POA: Diagnosis not present

## 2018-02-15 DIAGNOSIS — H401132 Primary open-angle glaucoma, bilateral, moderate stage: Secondary | ICD-10-CM | POA: Diagnosis not present

## 2018-02-25 DIAGNOSIS — J300 Vasomotor rhinitis: Secondary | ICD-10-CM | POA: Diagnosis not present

## 2018-02-25 DIAGNOSIS — Z79899 Other long term (current) drug therapy: Secondary | ICD-10-CM | POA: Diagnosis not present

## 2018-02-25 DIAGNOSIS — J8489 Other specified interstitial pulmonary diseases: Secondary | ICD-10-CM | POA: Diagnosis not present

## 2018-02-25 DIAGNOSIS — M810 Age-related osteoporosis without current pathological fracture: Secondary | ICD-10-CM | POA: Diagnosis not present

## 2018-02-25 DIAGNOSIS — J4520 Mild intermittent asthma, uncomplicated: Secondary | ICD-10-CM | POA: Diagnosis not present

## 2018-02-25 DIAGNOSIS — G4700 Insomnia, unspecified: Secondary | ICD-10-CM | POA: Diagnosis not present

## 2018-02-25 DIAGNOSIS — R220 Localized swelling, mass and lump, head: Secondary | ICD-10-CM | POA: Diagnosis not present

## 2018-02-25 DIAGNOSIS — E039 Hypothyroidism, unspecified: Secondary | ICD-10-CM | POA: Diagnosis not present

## 2018-02-25 DIAGNOSIS — E7800 Pure hypercholesterolemia, unspecified: Secondary | ICD-10-CM | POA: Diagnosis not present

## 2018-02-25 DIAGNOSIS — K219 Gastro-esophageal reflux disease without esophagitis: Secondary | ICD-10-CM | POA: Diagnosis not present

## 2018-03-26 DIAGNOSIS — D225 Melanocytic nevi of trunk: Secondary | ICD-10-CM | POA: Diagnosis not present

## 2018-03-26 DIAGNOSIS — B0229 Other postherpetic nervous system involvement: Secondary | ICD-10-CM | POA: Diagnosis not present

## 2018-04-16 DIAGNOSIS — L0202 Furuncle of face: Secondary | ICD-10-CM | POA: Diagnosis not present

## 2018-04-19 DIAGNOSIS — H401132 Primary open-angle glaucoma, bilateral, moderate stage: Secondary | ICD-10-CM | POA: Diagnosis not present

## 2018-04-19 DIAGNOSIS — H401112 Primary open-angle glaucoma, right eye, moderate stage: Secondary | ICD-10-CM | POA: Diagnosis not present

## 2018-04-19 DIAGNOSIS — H401122 Primary open-angle glaucoma, left eye, moderate stage: Secondary | ICD-10-CM | POA: Diagnosis not present

## 2018-07-29 ENCOUNTER — Other Ambulatory Visit: Payer: Self-pay | Admitting: Family Medicine

## 2018-07-29 DIAGNOSIS — Z1231 Encounter for screening mammogram for malignant neoplasm of breast: Secondary | ICD-10-CM

## 2018-08-23 DIAGNOSIS — M5134 Other intervertebral disc degeneration, thoracic region: Secondary | ICD-10-CM | POA: Diagnosis not present

## 2018-08-23 DIAGNOSIS — M9902 Segmental and somatic dysfunction of thoracic region: Secondary | ICD-10-CM | POA: Diagnosis not present

## 2018-08-25 DIAGNOSIS — M9902 Segmental and somatic dysfunction of thoracic region: Secondary | ICD-10-CM | POA: Diagnosis not present

## 2018-08-25 DIAGNOSIS — M5134 Other intervertebral disc degeneration, thoracic region: Secondary | ICD-10-CM | POA: Diagnosis not present

## 2018-08-27 ENCOUNTER — Encounter: Payer: Self-pay | Admitting: Pulmonary Disease

## 2018-08-27 ENCOUNTER — Ambulatory Visit: Payer: Medicare HMO

## 2018-08-27 ENCOUNTER — Ambulatory Visit (INDEPENDENT_AMBULATORY_CARE_PROVIDER_SITE_OTHER)
Admission: RE | Admit: 2018-08-27 | Discharge: 2018-08-27 | Disposition: A | Discharge status: Home / Self Care | Payer: 59 | Source: Ambulatory Visit | Attending: Pulmonary Disease | Admitting: Pulmonary Disease

## 2018-08-27 ENCOUNTER — Telehealth: Payer: Self-pay | Admitting: Pulmonary Disease

## 2018-08-27 ENCOUNTER — Ambulatory Visit: Payer: 59 | Admitting: Pulmonary Disease

## 2018-08-27 VITALS — BP 142/82 | HR 84 | Temp 98.0°F | Ht 66.5 in | Wt 173.0 lb

## 2018-08-27 DIAGNOSIS — J0100 Acute maxillary sinusitis, unspecified: Secondary | ICD-10-CM | POA: Diagnosis not present

## 2018-08-27 DIAGNOSIS — R05 Cough: Secondary | ICD-10-CM | POA: Diagnosis not present

## 2018-08-27 DIAGNOSIS — J441 Chronic obstructive pulmonary disease with (acute) exacerbation: Secondary | ICD-10-CM | POA: Diagnosis not present

## 2018-08-27 MED ORDER — PREDNISONE 10 MG PO TABS: ORAL_TABLET | ORAL | 0 refills | Status: DC

## 2018-08-27 MED ORDER — AMOXICILLIN-POT CLAVULANATE 875-125 MG PO TABS: 1.0000 | ORAL_TABLET | Freq: Two times a day (BID) | ORAL | 0 refills | Status: DC

## 2018-08-27 MED ORDER — AZELASTINE HCL 0.1 % NA SOLN: 1.0000 | Freq: Two times a day (BID) | NASAL | 4 refills | Status: DC

## 2018-08-27 NOTE — Progress Notes (Signed)
@Patient  ID: [name], female    DOB: 12-29-1940, 77 y.o.   MRN: [id number]  Chief Complaint  Patient presents with  . Acute Visit    Since 08/23/18 she has had chest and nasal congestion. No fever . Coughing up green mucus.    Referring provider: Carol Ada, MD  HPI:  77 year old female never smoker followed in our office for COPD as well as past medical history of BOOP  Smoker/ Smoking History: Never Smoker. Passive smoke exposure.  Maintenance:  Symbicort 160 Pt of: [place]  08/27/2018  - Visit   77 year old female patient presenting today for acute visit.  Patient reports that symptoms started on 08/23/2018.  Patient reporting increased sinus pain, pressure, headaches, subjective fevers, productive cough with green mucus started on 08/25/2018.  Patient reports that since 08/23/2018 patient has had to use rescue inhaler one time a day.  This is a change from her baseline which is when she does not have to use it regularly at all.  Patient has been adherent to her Symbicort 160.  Patient wanted to present today as she has a clinical history of BOOP and wanted to ensure that she was not getting worse.   Tests:   FENO:  No results found for: NITRICOXIDE  PFT: PFT Results Latest Ref Rng & Units 08/09/2015  FVC-Pre L 2.95  FVC-Predicted Pre % 99  FVC-Post L 2.90  FVC-Predicted Post % 97  Pre FEV1/FVC % % 57  Post FEV1/FCV % % 59  FEV1-Pre L 1.69  FEV1-Predicted Pre % 75  DLCO UNC% % 80  DLCO COR %Predicted % 93  TLC L 4.55  TLC % Predicted % 87  RV % Predicted % 77    Imaging: No results found.  Chart Review:    Specialty Problems      Pulmonary Problems   Asthma    Qualifier: Diagnosis of  By: Annamaria Boots MD, Clinton D       Chronic rhinitis    Qualifier: Diagnosis of  By: Annamaria Boots MD, Clinton D       COPD (chronic obstructive pulmonary disease) ([place])    PFT 07/20/09   Attributed to BOOP/COP with partial RUL resection 2007,  and hx mild asthma.  Significant second-hand smoke exposure        BOOP (bronchiolitis obliterans with organizing pneumonia) ([place])   Acute bronchitis   Sinusitis, acute maxillary      Allergies  Allergen Reactions  . Alphagan [Brimonidine] Itching and Other (See Comments)    RED  . Percocet [Oxycodone-Acetaminophen]     Rapid heart rate  . Travoprost     INTOLERANT TO TRAVATAN - eyes red and burning    Immunization History  Administered Date(s) Administered  . Influenza Split 07/07/2013  . Influenza Whole 08/02/2009, 09/08/2012  . Influenza,inj,Quad PF,6+ Mos 08/09/2015, 08/25/2016, 08/10/2017  . Pneumococcal-Unspecified 12/28/2006, 08/22/2014  . Zoster 12/11/2012    Past Medical History:  Diagnosis Date  . Other acute sinusitis   . Other diseases of nasal cavity and sinuses(478.19)   . Pure hypercholesterolemia   . Unspecified asthma(493.90)     Tobacco History: Social History   Tobacco Use  Smoking Status Passive Smoke Exposure - Never Smoker  Smokeless Tobacco Never Used   Counseling given: Not Answered  Continue to not smoke   Outpatient Encounter Medications as of 08/27/2018  Medication Sig  . albuterol (PROAIR HFA) 108 (90 BASE) MCG/ACT inhaler Inhale 2 puffs into the lungs 4 (four) times daily as needed.    Marland Kitchen  Alendronate Sodium (FOSAMAX PO) Take by mouth. TAKE ONE TABLET ONCE WEEKLY  . ALPRAZolam (XANAX XR) 0.5 MG 24 hr tablet Take 0.5 mg by mouth as needed for anxiety.  Marland Kitchen aspirin 81 MG tablet Take 81 mg by mouth daily.    Marland Kitchen atorvastatin (LIPITOR) 10 MG tablet Take 10 mg by mouth daily.  . brinzolamide (AZOPT) 1 % ophthalmic suspension Place 1 drop into both eyes 2 (two) times daily.  . budesonide-formoterol (SYMBICORT) 160-4.5 MCG/ACT inhaler Inhale 2 puffs into the lungs 2 (two) times daily.    . calcium-vitamin D (OSCAL) 250-125 MG-UNIT per tablet Take 1 tablet by mouth daily.    . mometasone (NASONEX) 50 MCG/ACT nasal spray Place 2 sprays into the nose daily as  needed.   . Omega-3 Fatty Acids (FISH OIL) 1000 MG CAPS Take 1 capsule by mouth daily.    . pantoprazole (PROTONIX) 40 MG tablet Take 40 mg by mouth daily.    Marland Kitchen rOPINIRole (REQUIP) 1 MG tablet Take 1 mg by mouth at bedtime.  Marland Kitchen SYNTHROID 50 MCG tablet TAKE 1 TABLET BY MOUTH ONCE EVERY MORNING ON AN EMPTY STOMACH  . traZODone (DESYREL) 100 MG tablet Take 100 mg by mouth at bedtime.  . Vitamin D, Cholecalciferol, 400 UNITS CHEW Chew 1 tablet by mouth daily.   . Vitamin D, Ergocalciferol, (DRISDOL) 50000 UNITS CAPS capsule Take 50,000 Units by mouth every 7 (seven) days.  Marland Kitchen amoxicillin-clavulanate (AUGMENTIN) 875-125 MG tablet Take 1 tablet by mouth 2 (two) times daily.  Marland Kitchen azelastine (ASTEPRO) 0.1 % nasal spray Place 1 spray into both nostrils 2 (two) times daily.  . predniSONE (DELTASONE) 10 MG tablet Take 2 tablets (20mg  total) daily for the next 5 days. Take in the AM with food.  . [DISCONTINUED] azelastine (ASTEPRO) 137 MCG/SPRAY nasal spray Place 1 spray into the nose 2 (two) times daily as needed.    No facility-administered encounter medications on file as of 08/27/2018.    Review of Systems  Review of Systems  Constitutional: Positive for fatigue. Negative for chills, fever (subjective fevers ) and unexpected weight change.  HENT: Positive for congestion, dental problem (recent dental procedure in Sept/2019 - treated with abx, abscess tooth), hearing loss (Right ear feels full ), postnasal drip, sinus pressure and sinus pain. Negative for ear pain.   Respiratory: Positive for cough (coughing worse at night, productive green mucous ), shortness of breath and wheezing. Negative for chest tightness.   Cardiovascular: Negative for chest pain and palpitations.  Gastrointestinal: Negative for blood in stool, diarrhea, nausea and vomiting.  Genitourinary: Negative for dysuria, frequency and urgency.  Musculoskeletal: Negative for arthralgias.  Skin: Negative for color change.    Allergic/Immunologic: Negative for environmental allergies and food allergies.  Neurological: Negative for dizziness, light-headedness and headaches.  Psychiatric/Behavioral: Negative for dysphoric mood. The patient is not nervous/anxious.   All other systems reviewed and are negative.    Physical Exam  BP (!) 142/82 (BP Location: Left Arm, Patient Position: Sitting, Cuff Size: Normal)   Pulse 84   Temp 98 F (36.7 C) (Oral)   Ht 5' 6.5" (1.689 m)   Wt 173 lb (78.5 kg)   SpO2 93%   BMI 27.50 kg/m   Wt Readings from Last 5 Encounters:  08/27/18 173 lb (78.5 kg)  09/23/17 180 lb (81.6 kg)  01/29/17 181 lb 3.2 oz (82.2 kg)  09/15/16 179 lb (81.2 kg)  04/15/16 171 lb 6.4 oz (77.7 kg)    Physical Exam  Constitutional: She is oriented to person, place, and time and well-developed, well-nourished, and in no distress. Vital signs are normal. No distress.  HENT:  Head: Normocephalic and atraumatic.  Right Ear: Hearing, external ear and ear canal normal.  Left Ear: Hearing, external ear and ear canal normal.  Nose: Mucosal edema present. Right sinus exhibits maxillary sinus tenderness and frontal sinus tenderness. Left sinus exhibits maxillary sinus tenderness and frontal sinus tenderness.  Mouth/Throat: Uvula is midline and oropharynx is clear and moist. No oropharyngeal exudate.  +TMs with effusion without infection, +PNP   Eyes: Pupils are equal, round, and reactive to light.  Neck: Normal range of motion. Neck supple. No JVD present.  Cardiovascular: Normal rate, regular rhythm and normal heart sounds.  Pulmonary/Chest: Effort normal. No accessory muscle usage. No respiratory distress. She has no decreased breath sounds. She has wheezes (exp wheeze). She has no rhonchi.  Abdominal: Soft. Bowel sounds are normal. There is no tenderness.  Musculoskeletal: Normal range of motion. She exhibits no edema.  Lymphadenopathy:    She has no cervical adenopathy.  Neurological: She is  alert and oriented to person, place, and time. Gait normal.  Skin: Skin is warm and dry. She is not diaphoretic. No erythema.  Psychiatric: Mood, memory, affect and judgment normal.  Nursing note and vitals reviewed.    Lab Results:  CBC    Component Value Date/Time   WBC 6.4 04/16/2011 1133   RBC 4.13 04/16/2011 1133   HGB 12.6 04/16/2011 1133   HCT 37.3 04/16/2011 1133   PLT 209.0 04/16/2011 1133   MCV 90.3 04/16/2011 1133   MCHC 33.8 04/16/2011 1133   RDW 13.1 04/16/2011 1133   LYMPHSABS 1.5 04/16/2011 1133   MONOABS 0.8 04/16/2011 1133   EOSABS 0.0 04/16/2011 1133   BASOSABS 0.1 04/16/2011 1133    BMET    Component Value Date/Time   NA 142 03/10/2007 1627   K 4.3 03/10/2007 1627   CL 102 03/10/2007 1627   CO2 34 (H) 03/10/2007 1627   GLUCOSE 93 03/10/2007 1627   BUN 13 03/10/2007 1627   CREATININE 0.8 03/10/2007 1627   CALCIUM 9.2 03/10/2007 1627   GFRNONAA 77 03/10/2007 1627   GFRAA 93 03/10/2007 1627    BNP No results found for: BNP  ProBNP No results found for: PROBNP    Assessment & Plan:   Pleasant 77 year old patient seen office visit today.  Patient with acute sinusitis as well as probable COPD exacerbation.  Will treat with Augmentin as well as short course of prednisone.  Continue patient on Symbicort 160.  Refilled Astelin spray as well as encourage patient do nasal saline rinses.  Will get chest x-ray today.  Keep follow-up with Dr. Lamonte Sakai can follow-up sooner if symptoms are not improving or if new acute symptoms develop.  Sinusitis, acute maxillary Augmentin >>> Take 1 875-125 mg tablet every 12 hours for the next 7 days >>> Take with food  Prednisone 10mg  tablet  >>>Take 2 tablets (20 mg total) daily for the next 5 days >>> Take with food in the morning  Continue Symbicort 160 >>> 2 puffs in the morning right when you wake up, rinse out your mouth after use, 12 hours later 2 puffs, rinse after use >>> Take this daily, no matter  what >>> This is not a rescue inhaler   Keep follow-up with Dr. Lamonte Sakai  Refilled Astelin spray  Can do nasal saline rinses as needed as well   COPD (chronic obstructive pulmonary disease)  Potential COPD exac   Augmentin >>> Take 1 875-125 mg tablet every 12 hours for the next 7 days >>> Take with food  Prednisone 10mg  tablet  >>>Take 2 tablets (20 mg total) daily for the next 5 days >>> Take with food in the morning  Chest Xray today   Continue Symbicort 160 >>> 2 puffs in the morning right when you wake up, rinse out your mouth after use, 12 hours later 2 puffs, rinse after use >>> Take this daily, no matter what >>> This is not a rescue inhaler   Keep follow-up with Dr. Lamonte Sakai  Refilled Astelin spray  Can do nasal saline rinses as needed as well      Lauraine Rinne, NP 08/27/2018

## 2018-08-27 NOTE — Progress Notes (Signed)
Your chest x-ray results of come back.  Showing no acute changes.  No plan of care changes at this time.  Keep follow-up appointment.    Follow-up with our office if symptoms worsen or you do not feel like you are improving under her current regimen.  It was a pleasure taking care of you,  Fayne Mcguffee, FNP 

## 2018-08-27 NOTE — Progress Notes (Signed)
Spoke with pt and notified of results per Dr. Brian Mack. Pt verbalized understanding and denied any questions. 

## 2018-08-27 NOTE — Telephone Encounter (Signed)
Lauraine Rinne, NP sent to Amado Coe, RN        Your chest x-ray results of come back. Showing no acute changes. No plan of care changes at this time. Keep follow-up appointment.    Follow-up with our office if symptoms worsen or you do not feel like you are improving under her current regimen.   It was a pleasure taking care of you,   Wyn Quaker, FNP    Spoke with pt and notified of results per Aaron Edelman. Pt verbalized understanding and denied any questions.

## 2018-08-27 NOTE — Assessment & Plan Note (Signed)
Augmentin >>> Take 1 875-125 mg tablet every 12 hours for the next 7 days >>> Take with food  Prednisone 10mg  tablet  >>>Take 2 tablets (20 mg total) daily for the next 5 days >>> Take with food in the morning  Continue Symbicort 160 >>> 2 puffs in the morning right when you wake up, rinse out your mouth after use, 12 hours later 2 puffs, rinse after use >>> Take this daily, no matter what >>> This is not a rescue inhaler   Keep follow-up with Dr. Lamonte Sakai  Refilled Astelin spray  Can do nasal saline rinses as needed as well

## 2018-08-27 NOTE — Patient Instructions (Addendum)
Augmentin >>> Take 1 875-125 mg tablet every 12 hours for the next 7 days >>> Take with food  Prednisone 10mg  tablet  >>>Take 2 tablets (20 mg total) daily for the next 5 days >>> Take with food in the morning  Chest Xray today   Continue Symbicort 160 >>> 2 puffs in the morning right when you wake up, rinse out your mouth after use, 12 hours later 2 puffs, rinse after use >>> Take this daily, no matter what >>> This is not a rescue inhaler   Keep follow-up with Dr. Lamonte Sakai  Refilled Astelin spray  Can do nasal saline rinses as needed as well      November/2019 we will be moving! We will no longer be at our [place] location.  Be on the look out for a post card/mailer to let you know we have officially moved.  Our new address and phone number will be:  [address] [place], [place] [address] Telephone number: [phone]  It is flu season:   >>>Remember to be washing your hands regularly, using hand sanitizer, be careful to use around herself with has contact with people who are sick will increase her chances of getting sick yourself. >>> Best ways to protect herself from the flu: Receive the yearly flu vaccine, practice good hand hygiene washing with soap and also using hand sanitizer when available, eat a nutritious meals, get adequate rest, hydrate appropriately   Please contact the office if your symptoms worsen or you have concerns that you are not improving.   Thank you for choosing [facility] for your healthcare, and for allowing [place] to partner with you on your healthcare journey. I am thankful to be able to provide care to you today.   Wyn Quaker FNP-C

## 2018-08-27 NOTE — Assessment & Plan Note (Signed)
Potential COPD exac   Augmentin >>> Take 1 875-125 mg tablet every 12 hours for the next 7 days >>> Take with food  Prednisone 10mg  tablet  >>>Take 2 tablets (20 mg total) daily for the next 5 days >>> Take with food in the morning  Chest Xray today   Continue Symbicort 160 >>> 2 puffs in the morning right when you wake up, rinse out your mouth after use, 12 hours later 2 puffs, rinse after use >>> Take this daily, no matter what >>> This is not a rescue inhaler   Keep follow-up with Dr. Lamonte Sakai  Refilled Astelin spray  Can do nasal saline rinses as needed as well

## 2018-09-20 DIAGNOSIS — Z23 Encounter for immunization: Secondary | ICD-10-CM | POA: Diagnosis not present

## 2018-09-20 DIAGNOSIS — Z1389 Encounter for screening for other disorder: Secondary | ICD-10-CM | POA: Diagnosis not present

## 2018-09-20 DIAGNOSIS — G4700 Insomnia, unspecified: Secondary | ICD-10-CM | POA: Diagnosis not present

## 2018-09-20 DIAGNOSIS — J4520 Mild intermittent asthma, uncomplicated: Secondary | ICD-10-CM | POA: Diagnosis not present

## 2018-09-20 DIAGNOSIS — J8489 Other specified interstitial pulmonary diseases: Secondary | ICD-10-CM | POA: Diagnosis not present

## 2018-09-20 DIAGNOSIS — Z0000 Encounter for general adult medical examination without abnormal findings: Secondary | ICD-10-CM | POA: Diagnosis not present

## 2018-09-20 DIAGNOSIS — E7800 Pure hypercholesterolemia, unspecified: Secondary | ICD-10-CM | POA: Diagnosis not present

## 2018-09-20 DIAGNOSIS — E039 Hypothyroidism, unspecified: Secondary | ICD-10-CM | POA: Diagnosis not present

## 2018-09-20 DIAGNOSIS — K219 Gastro-esophageal reflux disease without esophagitis: Secondary | ICD-10-CM | POA: Diagnosis not present

## 2018-09-20 DIAGNOSIS — G2581 Restless legs syndrome: Secondary | ICD-10-CM | POA: Diagnosis not present

## 2018-09-27 ENCOUNTER — Encounter: Payer: Medicare HMO | Admitting: Nurse Practitioner

## 2018-09-27 ENCOUNTER — Encounter: Payer: Self-pay | Admitting: Nurse Practitioner

## 2018-10-28 ENCOUNTER — Encounter: Payer: Self-pay | Admitting: Emergency Medicine

## 2018-10-28 ENCOUNTER — Ambulatory Visit (INDEPENDENT_AMBULATORY_CARE_PROVIDER_SITE_OTHER): Payer: Medicare HMO | Admitting: Emergency Medicine

## 2018-10-28 DIAGNOSIS — J310 Chronic rhinitis: Secondary | ICD-10-CM | POA: Diagnosis not present

## 2018-10-28 DIAGNOSIS — J8489 Other specified interstitial pulmonary diseases: Secondary | ICD-10-CM

## 2018-10-28 DIAGNOSIS — J4520 Mild intermittent asthma, uncomplicated: Secondary | ICD-10-CM | POA: Diagnosis not present

## 2018-10-28 NOTE — Progress Notes (Signed)
Subjective:     Patient ID: [name], female   DOB: [birth year], 77 y.o.   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  She was seen here in October with upper respiratory symptoms and a possible bronchitis, treated with prednisone and Augmentin.  She is been managed on Symbicort.  Chest x-ray from 08/27/2018 reviewed by me shows no significant changes or infiltrates. Averages 1-2 AE a year. She remains active, is a bit more limited in the Summer heat. She uses albuterol less than once a week. Her cough is intermittent. Uses nasonex prn, take protonix prn.     Past Medical History:  Diagnosis Date  . Other acute sinusitis   . Other diseases of nasal cavity and sinuses(478.19)   . Pure hypercholesterolemia   . Unspecified asthma(493.90)     Past Surgical History:  Procedure Laterality Date  . CORNEAL TRANSPLANT    . 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 ROTATOR CUFF REPAIR     bilateral  . TONSILLECTOMY     twice    Current Outpatient Medications:  .  albuterol (PROAIR HFA) 108 (90 BASE) MCG/ACT inhaler, Inhale 2 puffs into the lungs 4 (four) times daily as needed.  , Disp: , Rfl:  .  ALPRAZolam (XANAX XR) 0.5 MG 24 hr tablet, Take 0.5 mg by mouth as needed for anxiety., Disp: , Rfl:  .  amoxicillin-clavulanate (AUGMENTIN) 875-125 MG tablet, Take 1 tablet by mouth 2 (two) times daily., Disp: 14 tablet, Rfl: 0 .  aspirin 81 MG tablet, Take 81 mg by mouth daily.  , Disp: , Rfl:  .  atorvastatin (LIPITOR) 10 MG tablet, Take 10 mg by mouth daily., Disp: , Rfl:  .  azelastine (ASTEPRO) 0.1 % nasal spray, Place 1 spray into both nostrils 2 (two) times daily., Disp: 30 mL, Rfl: 4 .  budesonide-formoterol (SYMBICORT) 160-4.5 MCG/ACT inhaler, Inhale 2 puffs into the lungs 2 (two) times  daily.  , Disp: , Rfl:  .  calcium-vitamin D (OSCAL) 250-125 MG-UNIT per tablet, Take 1 tablet by mouth daily.  , Disp: , Rfl:  .  dorzolamide (TRUSOPT) 2 % ophthalmic solution, INSTILL 1 DROP INTO BOTH EYES TWICE A DAY, Disp: , Rfl: 11 .  mometasone (NASONEX) 50 MCG/ACT nasal spray, Place 2 sprays into the nose daily as needed. , Disp: , Rfl:  .  Omega-3 Fatty Acids (FISH OIL) 1000 MG CAPS, Take 1 capsule by mouth daily.  , Disp: , Rfl:  .  pantoprazole (PROTONIX) 40 MG tablet, Take 40 mg by mouth daily.  , Disp: , Rfl:  .  predniSONE (DELTASONE) 10 MG tablet, Take 2 tablets (20mg  total) daily for the next 5 days. Take in the AM with food., Disp: 10 tablet, Rfl: 0 .  rOPINIRole (REQUIP) 1 MG tablet, Take 1 mg by mouth at bedtime., Disp: , Rfl:  .  SYNTHROID 50 MCG tablet, TAKE 1 TABLET BY MOUTH ONCE EVERY MORNING ON AN EMPTY STOMACH, Disp: , Rfl: 5 .  traZODone (DESYREL) 100 MG tablet, Take 100 mg by mouth at bedtime., Disp: , Rfl: 1 .  Vitamin D, Cholecalciferol, 400 UNITS CHEW, Chew 1 tablet by mouth daily. , Disp: , Rfl:  .  Vitamin D, Ergocalciferol, (DRISDOL) 50000 UNITS CAPS capsule, Take 50,000 Units by mouth every 7 (  seven) days., Disp: , Rfl:    Review of Systems As per HPI   Objective:   Physical Exam Vitals:   10/28/18 0948  BP: 128/80  Pulse: 71  SpO2: 98%  Weight: 173 lb (78.5 kg)  Height: 5' 6.5" (1.689 m)    Gen: Pleasant, well-nourished, in no distress,  normal affect  ENT: No lesions,  mouth clear,  oropharynx clear, no postnasal drip  Neck: No JVD, no TMG, no carotid bruits  Lungs: No use of accessory muscles, clear without rales or rhonchi  Cardiovascular: RRR, heart sounds normal, no murmur or gallops, no peripheral edema  Musculoskeletal: No deformities, no cyanosis or clubbing  Neuro: alert, non focal  Skin: Warm, no lesions or rashes   Assessment:     No problem-specific Assessment & Plan notes found for this encounter.                                                                                                                                                                                                                                                                                                                                                                                                                                                 Baltazar Apo, MD, PhD 10/28/2018, 9:52 AM [facility] [phone] or if no answer [phone]

## 2018-10-28 NOTE — Patient Instructions (Signed)
Please continue Symbicort 2 puffs twice a day.  Remember to rinse and gargle after using. Keep your albuterol available to use 2 puffs if needed for shortness of breath, chest tightness, wheezing. Keep your Nasonex available to use as needed.  If your congestion increases then you may want to start taking it every day.  Consider this during the spring and fall months. Keep your Protonix available to use if needed for reflux.  If your reflux becomes more active than would favor taking every day as this could be a trigger for your asthma. Flu shot is up-to-date We will plan to do a chest x-ray once a year Follow with Dr. Lamonte Sakai in 12 months or sooner if you have any problems.

## 2018-10-28 NOTE — Assessment & Plan Note (Signed)
Remote history of organizing pneumonia, unclear trigger.  Has not recurred.  We are following clinically and with annual chest x-rays

## 2018-10-28 NOTE — Assessment & Plan Note (Signed)
Please continue Symbicort 2 puffs twice a day.  Remember to rinse and gargle after using. Keep your albuterol available to use 2 puffs if needed for shortness of breath, chest tightness, wheezing. Keep your Nasonex available to use as needed.  If your congestion increases then you may want to start taking it every day.  Consider this during the spring and fall months. Keep your Protonix available to use if needed for reflux.  If your reflux becomes more active than would favor taking every day as this could be a trigger for your asthma. Flu shot is up-to-date Follow with Dr. Lamonte Sakai in 12 months or sooner if you have any problems.

## 2018-10-28 NOTE — Assessment & Plan Note (Signed)
Not currently bothering her significantly.  Has nasal steroid available

## 2018-11-08 DIAGNOSIS — H401112 Primary open-angle glaucoma, right eye, moderate stage: Secondary | ICD-10-CM | POA: Diagnosis not present

## 2018-11-08 DIAGNOSIS — H401132 Primary open-angle glaucoma, bilateral, moderate stage: Secondary | ICD-10-CM | POA: Diagnosis not present

## 2018-11-08 DIAGNOSIS — H401122 Primary open-angle glaucoma, left eye, moderate stage: Secondary | ICD-10-CM | POA: Diagnosis not present

## 2018-12-24 DIAGNOSIS — J4520 Mild intermittent asthma, uncomplicated: Secondary | ICD-10-CM | POA: Diagnosis not present

## 2018-12-24 DIAGNOSIS — J208 Acute bronchitis due to other specified organisms: Secondary | ICD-10-CM | POA: Diagnosis not present

## 2018-12-24 DIAGNOSIS — Z20828 Contact with and (suspected) exposure to other viral communicable diseases: Secondary | ICD-10-CM | POA: Diagnosis not present

## 2019-01-10 ENCOUNTER — Encounter: Payer: Self-pay | Admitting: Pulmonary Disease

## 2019-01-10 ENCOUNTER — Telehealth: Payer: Self-pay | Admitting: Pulmonary Disease

## 2019-01-10 ENCOUNTER — Ambulatory Visit: Payer: Medicare HMO | Admitting: Pulmonary Disease

## 2019-01-10 ENCOUNTER — Ambulatory Visit (INDEPENDENT_AMBULATORY_CARE_PROVIDER_SITE_OTHER)
Admission: RE | Admit: 2019-01-10 | Discharge: 2019-01-10 | Disposition: A | Discharge status: Home / Self Care | Payer: Medicare HMO | Source: Ambulatory Visit | Attending: Pulmonary Disease | Admitting: Pulmonary Disease

## 2019-01-10 VITALS — BP 136/80 | HR 90 | Temp 98.7°F | Ht 66.5 in | Wt 171.4 lb

## 2019-01-10 DIAGNOSIS — J8489 Other specified interstitial pulmonary diseases: Secondary | ICD-10-CM

## 2019-01-10 DIAGNOSIS — J111 Influenza due to unidentified influenza virus with other respiratory manifestations: Secondary | ICD-10-CM | POA: Insufficient documentation

## 2019-01-10 DIAGNOSIS — J310 Chronic rhinitis: Secondary | ICD-10-CM | POA: Diagnosis not present

## 2019-01-10 DIAGNOSIS — I700 Atherosclerosis of aorta: Secondary | ICD-10-CM | POA: Diagnosis not present

## 2019-01-10 DIAGNOSIS — J441 Chronic obstructive pulmonary disease with (acute) exacerbation: Secondary | ICD-10-CM | POA: Diagnosis not present

## 2019-01-10 DIAGNOSIS — J209 Acute bronchitis, unspecified: Secondary | ICD-10-CM | POA: Diagnosis not present

## 2019-01-10 DIAGNOSIS — J449 Chronic obstructive pulmonary disease, unspecified: Secondary | ICD-10-CM | POA: Diagnosis not present

## 2019-01-10 DIAGNOSIS — R69 Illness, unspecified: Secondary | ICD-10-CM | POA: Diagnosis not present

## 2019-01-10 LAB — RESPIRATORY VIRUS PANEL
HMPV: NOT DETECTED
Influenza A RNA: DETECTED — AB
Influenza B RNA: NOT DETECTED
RSV RNA: NOT DETECTED

## 2019-01-10 MED ORDER — OSELTAMIVIR PHOSPHATE 75 MG PO CAPS: 75.0000 mg | ORAL_CAPSULE | Freq: Two times a day (BID) | ORAL | 0 refills | Status: DC

## 2019-01-10 NOTE — Assessment & Plan Note (Signed)
Assessment: Mild AR flare  Plan: Continue Astelin nasal spray Continue Nasonex nasal spray

## 2019-01-10 NOTE — Progress Notes (Signed)
Your chest x-ray results of come back.  Showing no acute changes.  No plan of care changes at this time.  Keep follow-up appointment.    Follow-up with our office if symptoms worsen or you do not feel like you are improving under her current regimen.  It was a pleasure taking care of you,  Brian Mack, FNP 

## 2019-01-10 NOTE — Addendum Note (Signed)
Addended by: Joella Prince on: 01/10/2019 04:50 PM   Modules accepted: Orders

## 2019-01-10 NOTE — Progress Notes (Signed)
@Patient  ID: [name], female    DOB: 04-03-1941, 78 y.o.   MRN: [id number]  Chief Complaint  Patient presents with  . Follow-up    with shortness of breath, productive cough (with yellow congestion) and wheezing. 101 temp Saturday.    Referring provider: Carol Ada, MD  HPI:  78 year old female never smoker followed in our office for Asthma COPD Overlap as well as past medical history of BOOP  Smoker/ Smoking History: Never Smoker. Passive smoke exposure.  Maintenance:  Symbicort 160 Pt of: Dr. Lamonte Sakai  01/10/2019  - Visit   78 year old female never smoker presenting to office today for an acute visit.  Patient reporting that she initially developed some cough as well as wheezing a few weeks ago.  Patient was seen by primary care on 12/17/2018.  Patient was found to have an upper respiratory infection was treated with prednisone.  Patient was not treated with antibiotic.  She reports that symptoms got slightly better and started to worsen on 01/07/2019.  Patient reports that symptoms started with fatigue, chills, body ache as well as increased cough and wheezing.  Patient reports that symptoms worsened on 01/08/2019 and she had a T-max of 101.  Patient reports recent sick contacts to family members in the beginning of February tested positive for flu.  Patient also reports that her daughter who she spent time with on 01/08/2019 has had similar symptoms with a fever as high as 103.  Patient reports adherence to her Symbicort 160.  Has had use her rescue inhaler 2 times today.  Patient received her flu vaccine in October/2019 at her primary care office.  Patient feels that she may benefit from an antibiotic as well as another prednisone taper today.  MMRC - Breathlessness Score 2 - on level ground, I walk slower than people of the same age because of breathlessness, or have to stop for breathe when walking to my own pace    Tests:   08/27/2018-chest x-ray-stable since 2018, no acute  cardiopulmonary abnormality  02/08/2010-CT maxillofacial without contrast- frontal sinuses are clear except for small amount of fluid in the far left corner air cell, ethmoid sphenoid and maxillary sinuses are all clear  08/09/2015-pulmonary function test- FVC 2.95 (99% predicted), postbronchodilator ratio 59, postbronchodilator FEV1 1.72 (76% predicted), no bronchodilator response, DLCO 80 >>>Moderate airflow limitation, no significant response of bronchodilator, decrease in RV suggest restrictive lung disease, normal DLCO  FENO:  No results found for: NITRICOXIDE  PFT: PFT Results Latest Ref Rng & Units 08/09/2015  FVC-Pre L 2.95  FVC-Predicted Pre % 99  FVC-Post L 2.90  FVC-Predicted Post % 97  Pre FEV1/FVC % % 57  Post FEV1/FCV % % 59  FEV1-Pre L 1.69  FEV1-Predicted Pre % 75  FEV1-Post L 1.72  DLCO UNC% % 80  DLCO COR %Predicted % 93  TLC L 4.55  TLC % Predicted % 87  RV % Predicted % 77    Imaging: Dg Chest 2 View  Result Date: 01/10/2019 CLINICAL DATA:  COPD and BOOP follow up.  Hx asthma. EXAM: CHEST - 2 VIEW COMPARISON:  08/27/2018 FINDINGS: Heart is mildly enlarged but stable in configuration. There is atherosclerotic calcification of the thoracic aorta. Coarse reticular markings are identified throughout the lungs bilaterally, slightly increased over 2018 and 2016. No new focal consolidations or pleural effusions. IMPRESSION: Stable appearance of the chest. Electronically Signed   By: Nolon Nations M.D.   On: 01/10/2019 14:18      Specialty Problems  Pulmonary Problems   Asthma    Qualifier: Diagnosis of  By: Annamaria Boots MD, Clinton D       Chronic rhinitis    Qualifier: Diagnosis of  By: Annamaria Boots MD, Clinton D       COPD (chronic obstructive pulmonary disease) ([place])    08/09/2015-pulmonary function test- FVC 2.95 (99% predicted), postbronchodilator ratio 59, postbronchodilator FEV1 1.72 (76% predicted), no bronchodilator response, DLCO 80 >>>Moderate airflow  limitation, no significant response of bronchodilator, decrease in RV suggest restrictive lung disease, normal DLCO  Attributed to BOOP/COP with partial RUL resection 2007,  and hx mild asthma. Significant second-hand smoke exposure        BOOP (bronchiolitis obliterans with organizing pneumonia) ([place])   Acute bronchitis   Sinusitis, acute maxillary      Allergies  Allergen Reactions  . Alphagan [Brimonidine] Itching and Other (See Comments)    RED  . Percocet [Oxycodone-Acetaminophen]     Rapid heart rate  . Travoprost     INTOLERANT TO TRAVATAN - eyes red and burning    Immunization History  Administered Date(s) Administered  . Influenza Split 07/07/2013  . Influenza Whole 08/02/2009, 09/08/2012  . Influenza,inj,Quad PF,6+ Mos 08/09/2015, 08/25/2016, 08/10/2017  . Pneumococcal-Unspecified 12/28/2006, 08/22/2014  . Zoster 12/11/2012   Dr Tamala Julian - October/2019  Past Medical History:  Diagnosis Date  . Other acute sinusitis   . Other diseases of nasal cavity and sinuses(478.19)   . Pure hypercholesterolemia   . Unspecified asthma(493.90)     Tobacco History: Social History   Tobacco Use  Smoking Status Passive Smoke Exposure - Never Smoker  Smokeless Tobacco Never Used   Counseling given: Yes  Continue to not smoke  Outpatient Encounter Medications as of 01/10/2019  Medication Sig  . albuterol (PROAIR HFA) 108 (90 BASE) MCG/ACT inhaler Inhale 2 puffs into the lungs 4 (four) times daily as needed.    . ALPRAZolam (XANAX XR) 0.5 MG 24 hr tablet Take 0.5 mg by mouth as needed for anxiety.  Marland Kitchen amoxicillin-clavulanate (AUGMENTIN) 875-125 MG tablet Take 1 tablet by mouth 2 (two) times daily.  Marland Kitchen aspirin 81 MG tablet Take 81 mg by mouth daily.    Marland Kitchen atorvastatin (LIPITOR) 10 MG tablet Take 10 mg by mouth daily.  Marland Kitchen azelastine (ASTEPRO) 0.1 % nasal spray Place 1 spray into both nostrils 2 (two) times daily.  . budesonide-formoterol (SYMBICORT) 160-4.5 MCG/ACT inhaler  Inhale 2 puffs into the lungs 2 (two) times daily.    . calcium-vitamin D (OSCAL) 250-125 MG-UNIT per tablet Take 1 tablet by mouth daily.    . dorzolamide (TRUSOPT) 2 % ophthalmic solution INSTILL 1 DROP INTO BOTH EYES TWICE A DAY  . mometasone (NASONEX) 50 MCG/ACT nasal spray Place 2 sprays into the nose daily as needed.   . Omega-3 Fatty Acids (FISH OIL) 1000 MG CAPS Take 1 capsule by mouth daily.    . pantoprazole (PROTONIX) 40 MG tablet Take 40 mg by mouth daily.    . predniSONE (DELTASONE) 10 MG tablet Take 2 tablets (20mg  total) daily for the next 5 days. Take in the AM with food.  Marland Kitchen rOPINIRole (REQUIP) 1 MG tablet Take 1 mg by mouth at bedtime.  Marland Kitchen SYNTHROID 50 MCG tablet TAKE 1 TABLET BY MOUTH ONCE EVERY MORNING ON AN EMPTY STOMACH  . traZODone (DESYREL) 100 MG tablet Take 100 mg by mouth at bedtime.  . Vitamin D, Cholecalciferol, 400 UNITS CHEW Chew 1 tablet by mouth daily.   . Vitamin D, Ergocalciferol, (DRISDOL)  50000 UNITS CAPS capsule Take 50,000 Units by mouth every 7 (seven) days.  . mupirocin ointment (BACTROBAN) 2 % APPLY OINTMENT EXTERNALLY TO AFFECTED AREA THREE TIMES DAILY AS NEEDED   No facility-administered encounter medications on file as of 01/10/2019.      Review of Systems  Review of Systems  Constitutional: Positive for chills, fatigue and fever (Tmax - 01/08/2019 - 101).  HENT: Negative for congestion.   Respiratory: Positive for cough, shortness of breath and wheezing.   Cardiovascular: Negative for chest pain and palpitations.  Gastrointestinal: Negative for diarrhea, nausea and vomiting.  Musculoskeletal: Positive for arthralgias.     Physical Exam  BP 136/80 (BP Location: Left Arm, Patient Position: Sitting, Cuff Size: Normal)   Pulse 90   Temp 98.7 F (37.1 C)   Ht 5' 6.5" (1.689 m)   Wt 171 lb 6.4 oz (77.7 kg)   SpO2 96%   BMI 27.25 kg/m   Wt Readings from Last 5 Encounters:  01/10/19 171 lb 6.4 oz (77.7 kg)  10/28/18 173 lb (78.5 kg)    09/27/18 174 lb 6.4 oz (79.1 kg)  08/27/18 173 lb (78.5 kg)  09/23/17 180 lb (81.6 kg)    Physical Exam  Constitutional: She is oriented to person, place, and time and well-developed, well-nourished, and in no distress. Vital signs are normal. She appears toxic. She has a sickly appearance. No distress.  Frail elderly female  HENT:  Head: Normocephalic and atraumatic.  Right Ear: Hearing, tympanic membrane, external ear and ear canal normal.  Left Ear: Hearing, tympanic membrane, external ear and ear canal normal.  Nose: Mucosal edema and rhinorrhea present. Right sinus exhibits no maxillary sinus tenderness and no frontal sinus tenderness. Left sinus exhibits no maxillary sinus tenderness and no frontal sinus tenderness.  Mouth/Throat: Uvula is midline and oropharynx is clear and moist. No oropharyngeal exudate.  Postnasal drip  Eyes: Pupils are equal, round, and reactive to light.  Neck: Normal range of motion. Neck supple. No JVD present.  Cardiovascular: Normal rate, regular rhythm and normal heart sounds.  Pulmonary/Chest: Effort normal and breath sounds normal. No accessory muscle usage. No respiratory distress. She has no decreased breath sounds. She has no wheezes. She has no rhonchi.  Abdominal: Soft. Bowel sounds are normal. There is no abdominal tenderness.  Musculoskeletal: Normal range of motion.        General: No edema.  Lymphadenopathy:    She has no cervical adenopathy.  Neurological: She is alert and oriented to person, place, and time. Gait normal.  Skin: Skin is warm and dry. She is not diaphoretic. No erythema.  Psychiatric: Mood, memory, affect and judgment normal.  Nursing note and vitals reviewed.     Lab Results:  CBC    Component Value Date/Time   WBC 6.4 04/16/2011 1133   RBC 4.13 04/16/2011 1133   HGB 12.6 04/16/2011 1133   HCT 37.3 04/16/2011 1133   PLT 209.0 04/16/2011 1133   MCV 90.3 04/16/2011 1133   MCHC 33.8 04/16/2011 1133   RDW 13.1  04/16/2011 1133   LYMPHSABS 1.5 04/16/2011 1133   MONOABS 0.8 04/16/2011 1133   EOSABS 0.0 04/16/2011 1133   BASOSABS 0.1 04/16/2011 1133    BMET    Component Value Date/Time   NA 142 03/10/2007 1627   K 4.3 03/10/2007 1627   CL 102 03/10/2007 1627   CO2 34 (H) 03/10/2007 1627   GLUCOSE 93 03/10/2007 1627   BUN 13 03/10/2007 1627   CREATININE 0.8  03/10/2007 1627   CALCIUM 9.2 03/10/2007 1627   GFRNONAA 77 03/10/2007 1627   GFRAA 93 03/10/2007 1627    BNP No results found for: BNP  ProBNP No results found for: PROBNP    Assessment & Plan:     COPD (chronic obstructive pulmonary disease) (HCC) Assessment: 78 year old female never smoker History of secondhand smoke exposure 2016 pulmonary function test showing FEV1 of 1.72 (76% predicted) Maintained on Symbicort 160 mMRC 2 today Patient reporting increased fatigue, cough, wheezing as well as temperatures over the last 3 to 4 days  Plan: Chest x-ray today Respiratory virus panel today Based off the results of the chest x-ray and RVP we could consider either antibiotic therapy, antivirals, and/or steroids Continue Symbicort 160 Keep yearly follow-up or present sooner if symptoms worsen   Chronic rhinitis Assessment: Mild AR flare  Plan: Continue Astelin nasal spray Continue Nasonex nasal spray   BOOP (bronchiolitis obliterans with organizing pneumonia) Assessment: Remote history of Boop Felt symptoms did improve after February/2020 treatment of steroids Recent chest x-ray in October/2019 was stable  Plan: Chest x-ray today   Acute bronchitis Assessment: Recent fevers Potential flu exposure Increased cough and wheezing Lungs clear to auscultation today  Plan: Chest x-ray today Respiratory virus panel today Could consider treatment as acute bronchitis if respiratory virus panel is negative and chest x-ray is clear  Influenza-like illness Assessment: Recent fever on 01/08/2019-T-max  101 Daughter was with patient on 01/08/2019 and is also had similar symptoms as well as a T-max of 103 Recent sick contacts to family members earlier in February/2023 were positive for flu Patient has received flu vaccine this season  Plan: Respiratory virus panel today >>> If positive for flu could consider Tamiflu     Lauraine Rinne, NP 01/10/2019   This appointment was 30 min long with over 50% of the time in direct face-to-face patient care, assessment, plan of care, and follow-up.

## 2019-01-10 NOTE — Assessment & Plan Note (Signed)
Assessment: Recent fevers Potential flu exposure Increased cough and wheezing Lungs clear to auscultation today  Plan: Chest x-ray today Respiratory virus panel today Could consider treatment as acute bronchitis if respiratory virus panel is negative and chest x-ray is clear

## 2019-01-10 NOTE — Assessment & Plan Note (Signed)
Assessment: Recent fever on 01/08/2019-T-max 101 Daughter was with patient on 01/08/2019 and is also had similar symptoms as well as a T-max of 103 Recent sick contacts to family members earlier in February/2023 were positive for flu Patient has received flu vaccine this season  Plan: Respiratory virus panel today >>> If positive for flu could consider Tamiflu

## 2019-01-10 NOTE — Telephone Encounter (Signed)
Called the patient and made her aware of the results. Per Aaron Edelman the patient also tested positive for influenza. And Tamiflu will be sent to the pharmacy. Advised patient that she needs to make her family members aware, especially those with small children, so preventative medication can be issued. Patient requested for the Tamiflu to be sent to [facility] on [address].

## 2019-01-10 NOTE — Progress Notes (Signed)
Flu swab is positive for influenza A.  We will send in a prescription for Tamiflu.  Please encourage the patient to notify any family members that she has been around that she is positive for flu A.  I also encouraged her to talk with her daughter as her daughter can be tested.  Tamiflu 75 mg tablet >>>Take 1 tablet every 12 hours for the next 5 days >>> Take with food  Please send in the prescription  If symptoms or not improving please contact our office.  Wyn Quaker, FNP

## 2019-01-10 NOTE — Assessment & Plan Note (Addendum)
Assessment: 78 year old female never smoker History of secondhand smoke exposure 2016 pulmonary function test showing FEV1 of 1.72 (76% predicted) Maintained on Symbicort 160 mMRC 2 today Patient reporting increased fatigue, cough, wheezing as well as temperatures over the last 3 to 4 days  Plan: Chest x-ray today Respiratory virus panel today Based off the results of the chest x-ray and RVP we could consider either antibiotic therapy, antivirals, and/or steroids Continue Symbicort 160 Keep yearly follow-up or present sooner if symptoms worsen

## 2019-01-10 NOTE — Patient Instructions (Addendum)
Respiratory Virus Panel today   Chest Xray today   Continue Symbicort 160 >>> 2 puffs in the morning right when you wake up, rinse out your mouth after use, 12 hours later 2 puffs, rinse after use >>> Take this daily, no matter what >>> This is not a rescue inhaler   Only use your albuterol as a rescue medication to be used if you can't catch your breath by resting or doing a relaxed purse lip breathing pattern.  - The less you use it, the better it will work when you need it. - Ok to use up to 2 puffs  every 4 hours if you must but call for immediate appointment if use goes up over your usual need - Don't leave home without it !!  (think of it like the spare tire for your car)   Can continue to take Tylenol as needed for elevated temperatures  Continue to hydrate appropriately Continue to eat nutritious meals  Keep planned follow-up with Dr. Lamonte Sakai in December/2020 >>>Can present to our office sooner if symptoms are worsening      It is flu season:   >>> Best ways to protect herself from the flu: Receive the yearly flu vaccine, practice good hand hygiene washing with soap and also using hand sanitizer when available, eat a nutritious meals, get adequate rest, hydrate appropriately   Please contact the office if your symptoms worsen or you have concerns that you are not improving.   Thank you for choosing [facility] for your healthcare, and for allowing [place] to partner with you on your healthcare journey. I am thankful to be able to provide care to you today.   Wyn Quaker FNP-C

## 2019-01-10 NOTE — Assessment & Plan Note (Signed)
Assessment: Remote history of Boop Felt symptoms did improve after February/2020 treatment of steroids Recent chest x-ray in October/2019 was stable  Plan: Chest x-ray today

## 2019-01-11 ENCOUNTER — Telehealth: Payer: Self-pay | Admitting: Pulmonary Disease

## 2019-01-11 NOTE — Telephone Encounter (Signed)
Its likely that the patient was contagious over the weekend when she interacted with her daughter, and potentially even a few days earlier as the virus typically has an incubation.  I believe she also spent time with her granddaughter over the weekend.  If her granddaughter is asymptomatic then her pediatrician may start her on prophylactic Tamiflu which is a daily dose instead of a twice daily dose.  Prophylactic Tamiflu can help decrease her chances of contracting the virus.  As far as for the patient's daughter from what I remember the patient's daughter was symptomatic with a fever (temperatures between 102 - 103).  I would recommend that patient's daughter be tested by her primary care provider and could potentially also need Tamiflu prescription as well.  This would help decrease the likelihood of her spreading it to her child.  As well as limit the length of symptoms.  I would recommend the patient complete her 5-day course of Tamiflu as prescribed.  This will help limit the replication of the virus as well as decrease the likelihood of her spreading the virus.  Make sure she is hydrating well and eating well.  Hopefully this helps clarify and answer some questions.  Wyn Quaker, FNP

## 2019-01-11 NOTE — Telephone Encounter (Signed)
Called and spoke with patient, patient stated that she had some questions concerning flu. Patient was seen on 01/10/19 by Wyn Quaker and tested positive for Flu A by respiratory panel. Patient was prescribed Tamiflu. Patient stated that she took one dose last night and has taken one dose this morning. Patient is aware that her daughter also needs to be tested for preventative purposes. Patient has questions concerning how how she will be contagious and how long before symptoms were shown was she contagious, patient was around her other daughter and her granddaughter last weekend and she wanted to make sure that they were in the clear.   Aaron Edelman please advise, thank you.   Lab result was read over to patient again.

## 2019-01-11 NOTE — Telephone Encounter (Signed)
Called and spoke with patient, advised her of response from [place] below. Patient verbalized understanding and stated that there were no further questions or concerns. Nothing further needed.

## 2019-03-21 DIAGNOSIS — G2581 Restless legs syndrome: Secondary | ICD-10-CM | POA: Diagnosis not present

## 2019-03-21 DIAGNOSIS — J4520 Mild intermittent asthma, uncomplicated: Secondary | ICD-10-CM | POA: Diagnosis not present

## 2019-03-21 DIAGNOSIS — E7800 Pure hypercholesterolemia, unspecified: Secondary | ICD-10-CM | POA: Diagnosis not present

## 2019-03-21 DIAGNOSIS — K219 Gastro-esophageal reflux disease without esophagitis: Secondary | ICD-10-CM | POA: Diagnosis not present

## 2019-03-21 DIAGNOSIS — G4700 Insomnia, unspecified: Secondary | ICD-10-CM | POA: Diagnosis not present

## 2019-03-21 DIAGNOSIS — E039 Hypothyroidism, unspecified: Secondary | ICD-10-CM | POA: Diagnosis not present

## 2019-05-31 DIAGNOSIS — E039 Hypothyroidism, unspecified: Secondary | ICD-10-CM | POA: Diagnosis not present

## 2019-05-31 DIAGNOSIS — E559 Vitamin D deficiency, unspecified: Secondary | ICD-10-CM | POA: Diagnosis not present

## 2019-05-31 DIAGNOSIS — E7800 Pure hypercholesterolemia, unspecified: Secondary | ICD-10-CM | POA: Diagnosis not present

## 2019-07-07 DIAGNOSIS — M810 Age-related osteoporosis without current pathological fracture: Secondary | ICD-10-CM | POA: Diagnosis not present

## 2019-07-07 DIAGNOSIS — E039 Hypothyroidism, unspecified: Secondary | ICD-10-CM | POA: Diagnosis not present

## 2019-07-07 DIAGNOSIS — J4520 Mild intermittent asthma, uncomplicated: Secondary | ICD-10-CM | POA: Diagnosis not present

## 2019-07-07 DIAGNOSIS — E7800 Pure hypercholesterolemia, unspecified: Secondary | ICD-10-CM | POA: Diagnosis not present

## 2019-07-11 DIAGNOSIS — Z79899 Other long term (current) drug therapy: Secondary | ICD-10-CM | POA: Diagnosis not present

## 2019-07-11 DIAGNOSIS — Z9841 Cataract extraction status, right eye: Secondary | ICD-10-CM | POA: Diagnosis not present

## 2019-07-11 DIAGNOSIS — Z9849 Cataract extraction status, unspecified eye: Secondary | ICD-10-CM | POA: Diagnosis not present

## 2019-07-11 DIAGNOSIS — Z9842 Cataract extraction status, left eye: Secondary | ICD-10-CM | POA: Diagnosis not present

## 2019-07-11 DIAGNOSIS — H02005 Unspecified entropion of left lower eyelid: Secondary | ICD-10-CM | POA: Diagnosis not present

## 2019-07-11 DIAGNOSIS — Z947 Corneal transplant status: Secondary | ICD-10-CM | POA: Diagnosis not present

## 2019-07-11 DIAGNOSIS — Z9889 Other specified postprocedural states: Secondary | ICD-10-CM | POA: Diagnosis not present

## 2019-07-11 DIAGNOSIS — Z961 Presence of intraocular lens: Secondary | ICD-10-CM | POA: Diagnosis not present

## 2019-09-02 ENCOUNTER — Encounter (INDEPENDENT_AMBULATORY_CARE_PROVIDER_SITE_OTHER): Payer: Self-pay

## 2019-10-03 DIAGNOSIS — M810 Age-related osteoporosis without current pathological fracture: Secondary | ICD-10-CM | POA: Diagnosis not present

## 2019-10-03 DIAGNOSIS — J4520 Mild intermittent asthma, uncomplicated: Secondary | ICD-10-CM | POA: Diagnosis not present

## 2019-10-03 DIAGNOSIS — Z0000 Encounter for general adult medical examination without abnormal findings: Secondary | ICD-10-CM | POA: Diagnosis not present

## 2019-10-03 DIAGNOSIS — K219 Gastro-esophageal reflux disease without esophagitis: Secondary | ICD-10-CM | POA: Diagnosis not present

## 2019-10-03 DIAGNOSIS — E039 Hypothyroidism, unspecified: Secondary | ICD-10-CM | POA: Diagnosis not present

## 2019-10-03 DIAGNOSIS — Z1389 Encounter for screening for other disorder: Secondary | ICD-10-CM | POA: Diagnosis not present

## 2019-10-03 DIAGNOSIS — E559 Vitamin D deficiency, unspecified: Secondary | ICD-10-CM | POA: Diagnosis not present

## 2019-10-03 DIAGNOSIS — G4700 Insomnia, unspecified: Secondary | ICD-10-CM | POA: Diagnosis not present

## 2019-10-03 DIAGNOSIS — G2581 Restless legs syndrome: Secondary | ICD-10-CM | POA: Diagnosis not present

## 2019-11-09 DIAGNOSIS — J019 Acute sinusitis, unspecified: Secondary | ICD-10-CM | POA: Diagnosis not present

## 2019-11-10 DIAGNOSIS — J019 Acute sinusitis, unspecified: Secondary | ICD-10-CM | POA: Diagnosis not present

## 2019-12-07 DIAGNOSIS — H401132 Primary open-angle glaucoma, bilateral, moderate stage: Secondary | ICD-10-CM | POA: Diagnosis not present

## 2019-12-07 DIAGNOSIS — H401122 Primary open-angle glaucoma, left eye, moderate stage: Secondary | ICD-10-CM | POA: Diagnosis not present

## 2019-12-07 DIAGNOSIS — H401112 Primary open-angle glaucoma, right eye, moderate stage: Secondary | ICD-10-CM | POA: Diagnosis not present

## 2019-12-10 ENCOUNTER — Ambulatory Visit: Payer: Medicare HMO

## 2019-12-11 DIAGNOSIS — J4520 Mild intermittent asthma, uncomplicated: Secondary | ICD-10-CM | POA: Diagnosis not present

## 2019-12-11 DIAGNOSIS — E7800 Pure hypercholesterolemia, unspecified: Secondary | ICD-10-CM | POA: Diagnosis not present

## 2019-12-11 DIAGNOSIS — M810 Age-related osteoporosis without current pathological fracture: Secondary | ICD-10-CM | POA: Diagnosis not present

## 2019-12-11 DIAGNOSIS — E039 Hypothyroidism, unspecified: Secondary | ICD-10-CM | POA: Diagnosis not present

## 2020-01-10 DIAGNOSIS — E039 Hypothyroidism, unspecified: Secondary | ICD-10-CM | POA: Diagnosis not present

## 2020-01-10 DIAGNOSIS — E785 Hyperlipidemia, unspecified: Secondary | ICD-10-CM | POA: Diagnosis not present

## 2020-01-30 DIAGNOSIS — R109 Unspecified abdominal pain: Secondary | ICD-10-CM | POA: Diagnosis not present

## 2020-02-07 DIAGNOSIS — H401132 Primary open-angle glaucoma, bilateral, moderate stage: Secondary | ICD-10-CM | POA: Diagnosis not present

## 2020-02-15 ENCOUNTER — Ambulatory Visit: Payer: Medicare HMO | Admitting: Emergency Medicine

## 2020-02-15 ENCOUNTER — Encounter: Payer: Self-pay | Admitting: Emergency Medicine

## 2020-02-15 ENCOUNTER — Other Ambulatory Visit: Payer: Self-pay

## 2020-02-15 DIAGNOSIS — J449 Chronic obstructive pulmonary disease, unspecified: Secondary | ICD-10-CM | POA: Diagnosis not present

## 2020-02-15 DIAGNOSIS — J8489 Other specified interstitial pulmonary diseases: Secondary | ICD-10-CM | POA: Diagnosis not present

## 2020-02-15 DIAGNOSIS — J441 Chronic obstructive pulmonary disease with (acute) exacerbation: Secondary | ICD-10-CM | POA: Diagnosis not present

## 2020-02-15 NOTE — Progress Notes (Signed)
Subjective:     Patient ID: [name], female   DOB: Dec 23, 1940, 79 y.o.   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  She was seen here in October with upper respiratory symptoms and a possible bronchitis, treated with prednisone and Augmentin.  She is been managed on Symbicort.  Chest x-ray from 08/27/2018 reviewed by me shows no significant changes or infiltrates. Averages 1-2 AE a year. She remains active, is a bit more limited in the Summer heat. She uses albuterol less than once a week. Her cough is intermittent. Uses nasonex prn, take protonix prn.   ROV 02/15/20 --this a follow-up visit for 79, never smoker with a history of cryptogenic organizing pneumonia (remote, 2007), associated moderate obstructive lung disease/asthma, allergic rhinitis, intermittent GERD.She reports that she has been doing fairly well - she does still have exertional SOB when she pushes her garbage cans, climbs hills. She has albuterol, uses it once a month. She is on Symbicort - has been informed that it is no longer on her insurance and that we will need to change. No real cough or wheeze. Never needs nasonex. Rare GERD, uses protonix prn. She has had once flare since last time - was treated for acute sinusitis in January '21, treated w Augmentin.  She has had her COVID vaccines.     Past Medical History:  Diagnosis Date  . Other acute sinusitis   . Other diseases of nasal cavity and sinuses(478.19)   . Pure hypercholesterolemia   . Unspecified asthma(493.90)     Past Surgical History:  Procedure Laterality Date  . CORNEAL TRANSPLANT    . 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 ROTATOR CUFF REPAIR     bilateral  . TONSILLECTOMY     twice    Current Outpatient Medications:  .  albuterol (PROAIR HFA)  108 (90 BASE) MCG/ACT inhaler, Inhale 2 puffs into the lungs 4 (four) times daily as needed.  , Disp: , Rfl:  .  amoxicillin-clavulanate (AUGMENTIN) 875-125 MG tablet, Take 1 tablet by mouth 2 (two) times daily., Disp: 14 tablet, Rfl: 0 .  aspirin 81 MG tablet, Take 81 mg by mouth daily.  , Disp: , Rfl:  .  atorvastatin (LIPITOR) 10 MG tablet, Take 10 mg by mouth daily., Disp: , Rfl:  .  azelastine (ASTEPRO) 0.1 % nasal spray, Place 1 spray into both nostrils 2 (two) times daily., Disp: 30 mL, Rfl: 4 .  budesonide-formoterol (SYMBICORT) 160-4.5 MCG/ACT inhaler, Inhale 2 puffs into the lungs 2 (two) times daily.  , Disp: , Rfl:  .  calcium-vitamin D (OSCAL) 250-125 MG-UNIT per tablet, Take 1 tablet by mouth daily.  , Disp: , Rfl:  .  dorzolamide (TRUSOPT) 2 % ophthalmic solution, INSTILL 1 DROP INTO BOTH EYES TWICE A DAY, Disp: , Rfl: 11 .  mometasone (NASONEX) 50 MCG/ACT nasal spray, Place 2 sprays into the nose daily as needed. , Disp: , Rfl:  .  mupirocin ointment (BACTROBAN) 2 %, APPLY OINTMENT EXTERNALLY TO AFFECTED AREA THREE TIMES DAILY AS NEEDED, Disp: , Rfl:  .  Omega-3 Fatty Acids (FISH OIL) 1000 MG CAPS, Take 1 capsule by mouth daily.  , Disp: , Rfl:  .  pantoprazole (PROTONIX) 40 MG tablet, Take 40 mg by mouth daily.  , Disp: , Rfl:  .  rOPINIRole (REQUIP) 1 MG tablet, Take 1 mg by mouth at bedtime., Disp: , Rfl:  .  SYNTHROID 50 MCG tablet, TAKE 1 TABLET BY MOUTH ONCE EVERY MORNING ON AN EMPTY STOMACH, Disp: , Rfl: 5 .  traZODone (DESYREL) 100 MG tablet, Take 100 mg by mouth at bedtime., Disp: , Rfl: 1 .  Vitamin D, Cholecalciferol, 400 UNITS CHEW, Chew 1 tablet by mouth daily. , Disp: , Rfl:    Review of Systems As per HPI   Objective:   Physical Exam Vitals:   02/15/20 1533  BP: 104/60  Pulse: 86  Temp: (!) 97.1 F (36.2 C)  TempSrc: Temporal  SpO2: 95%  Weight: 178 lb 6.4 oz (80.9 kg)  Height: 5' 6.5" (1.689 m)    Gen: Pleasant, well-nourished, in no distress,   normal affect  ENT: No lesions,  mouth clear,  oropharynx clear, no postnasal drip  Neck: No JVD, no stridor  Lungs: No use of accessory muscles, clear without rales or rhonchi  Cardiovascular: RRR, heart sounds normal, no murmur or gallops, no peripheral edema  Musculoskeletal: No deformities, no cyanosis or clubbing  Neuro: alert, non focal  Skin: Warm, no lesions or rashes   Assessment:     BOOP (bronchiolitis obliterans with organizing pneumonia) Remote, no evidence.  Consider repeat imaging.  Will plan timing going forward and based on clinical status  COPD (chronic obstructive pulmonary disease) ([place]) Well-controlled.  No significant contribution of GERD, allergic rhinitis.  She knows to treat these if they become bothersome.  Please continue Symbicort 2 puffs twice a day for now, as you have been taking it.  Rinse and gargle after using. Please find a copy of your inhaler formulary from your insurance company.  We need to review this so we can determine which inhaled medication would be a good substitute for Symbicort if it is no longer covered. Keep your albuterol available to use 2 puffs if needed for shortness of breath, chest tightness, wheezing. Your Covid vaccine is up-to-date Agree with using your Protonix intermittently if you are having reflux symptoms Agree with using your Nasonex spray intermittently if you are having allergy symptoms. Follow with Dr. Lamonte Sakai in 12 months or sooner if you have any problems.  Baltazar Apo, MD, PhD 02/15/2020,  3:50 PM [facility] [phone] or if no answer [phone]

## 2020-02-15 NOTE — Assessment & Plan Note (Signed)
Well-controlled.  No significant contribution of GERD, allergic rhinitis.  She knows to treat these if they become bothersome.  Please continue Symbicort 2 puffs twice a day for now, as you have been taking it.  Rinse and gargle after using. Please find a copy of your inhaler formulary from your insurance company.  We need to review this so we can determine which inhaled medication would be a good substitute for Symbicort if it is no longer covered. Keep your albuterol available to use 2 puffs if needed for shortness of breath, chest tightness, wheezing. Your Covid vaccine is up-to-date Agree with using your Protonix intermittently if you are having reflux symptoms Agree with using your Nasonex spray intermittently if you are having allergy symptoms. Follow with Dr. Lamonte Sakai in 12 months or sooner if you have any problems.

## 2020-02-15 NOTE — Patient Instructions (Addendum)
Please continue Symbicort 2 puffs twice a day for now, as you have been taking it.  Rinse and gargle after using. Please find a copy of your inhaler formulary from your insurance company.  We need to review this so we can determine which inhaled medication would be a good substitute for Symbicort if it is no longer covered. Keep your albuterol available to use 2 puffs if needed for shortness of breath, chest tightness, wheezing. Your Covid vaccine is up-to-date Agree with using your Protonix intermittently if you are having reflux symptoms Agree with using your Nasonex spray intermittently if you are having allergy symptoms. Follow with Dr. Lamonte Sakai in 12 months or sooner if you have any problems.

## 2020-02-15 NOTE — Assessment & Plan Note (Signed)
Remote, no evidence.  Consider repeat imaging.  Will plan timing going forward and based on clinical status

## 2020-05-02 DIAGNOSIS — J4520 Mild intermittent asthma, uncomplicated: Secondary | ICD-10-CM | POA: Diagnosis not present

## 2020-05-02 DIAGNOSIS — K219 Gastro-esophageal reflux disease without esophagitis: Secondary | ICD-10-CM | POA: Diagnosis not present

## 2020-05-02 DIAGNOSIS — E039 Hypothyroidism, unspecified: Secondary | ICD-10-CM | POA: Diagnosis not present

## 2020-05-02 DIAGNOSIS — N907 Vulvar cyst: Secondary | ICD-10-CM | POA: Diagnosis not present

## 2020-05-02 DIAGNOSIS — G4700 Insomnia, unspecified: Secondary | ICD-10-CM | POA: Diagnosis not present

## 2020-05-02 DIAGNOSIS — E7800 Pure hypercholesterolemia, unspecified: Secondary | ICD-10-CM | POA: Diagnosis not present

## 2020-05-11 DIAGNOSIS — L723 Sebaceous cyst: Secondary | ICD-10-CM | POA: Diagnosis not present

## 2020-05-15 DIAGNOSIS — N764 Abscess of vulva: Secondary | ICD-10-CM | POA: Diagnosis not present

## 2020-07-04 DIAGNOSIS — Z961 Presence of intraocular lens: Secondary | ICD-10-CM | POA: Diagnosis not present

## 2020-07-04 DIAGNOSIS — H524 Presbyopia: Secondary | ICD-10-CM | POA: Diagnosis not present

## 2020-07-04 DIAGNOSIS — H52203 Unspecified astigmatism, bilateral: Secondary | ICD-10-CM | POA: Diagnosis not present

## 2020-07-04 DIAGNOSIS — Z947 Corneal transplant status: Secondary | ICD-10-CM | POA: Diagnosis not present

## 2020-07-04 DIAGNOSIS — Z9889 Other specified postprocedural states: Secondary | ICD-10-CM | POA: Diagnosis not present

## 2020-07-04 DIAGNOSIS — H5203 Hypermetropia, bilateral: Secondary | ICD-10-CM | POA: Diagnosis not present

## 2020-09-11 DIAGNOSIS — I83813 Varicose veins of bilateral lower extremities with pain: Secondary | ICD-10-CM | POA: Diagnosis not present

## 2020-10-25 DIAGNOSIS — G4700 Insomnia, unspecified: Secondary | ICD-10-CM | POA: Diagnosis not present

## 2020-10-25 DIAGNOSIS — E039 Hypothyroidism, unspecified: Secondary | ICD-10-CM | POA: Diagnosis not present

## 2020-10-25 DIAGNOSIS — E7800 Pure hypercholesterolemia, unspecified: Secondary | ICD-10-CM | POA: Diagnosis not present

## 2020-10-25 DIAGNOSIS — R634 Abnormal weight loss: Secondary | ICD-10-CM | POA: Diagnosis not present

## 2020-10-25 DIAGNOSIS — M810 Age-related osteoporosis without current pathological fracture: Secondary | ICD-10-CM | POA: Diagnosis not present

## 2020-10-25 DIAGNOSIS — J4520 Mild intermittent asthma, uncomplicated: Secondary | ICD-10-CM | POA: Diagnosis not present

## 2020-10-25 DIAGNOSIS — G2581 Restless legs syndrome: Secondary | ICD-10-CM | POA: Diagnosis not present

## 2020-10-25 DIAGNOSIS — Z0000 Encounter for general adult medical examination without abnormal findings: Secondary | ICD-10-CM | POA: Diagnosis not present

## 2020-10-25 DIAGNOSIS — Z1389 Encounter for screening for other disorder: Secondary | ICD-10-CM | POA: Diagnosis not present

## 2020-11-22 ENCOUNTER — Ambulatory Visit
Admission: RE | Admit: 2020-11-22 | Discharge: 2020-11-22 | Disposition: A | Discharge status: Home / Self Care | Payer: Medicare HMO | Source: Ambulatory Visit | Attending: Family Medicine | Admitting: Family Medicine

## 2020-11-22 ENCOUNTER — Other Ambulatory Visit: Payer: Self-pay

## 2020-11-22 ENCOUNTER — Other Ambulatory Visit: Payer: Self-pay | Admitting: Family Medicine

## 2020-11-22 DIAGNOSIS — R600 Localized edema: Secondary | ICD-10-CM | POA: Diagnosis not present

## 2020-11-22 DIAGNOSIS — M79605 Pain in left leg: Secondary | ICD-10-CM

## 2020-12-18 DIAGNOSIS — E039 Hypothyroidism, unspecified: Secondary | ICD-10-CM | POA: Diagnosis not present

## 2021-02-14 ENCOUNTER — Ambulatory Visit: Payer: Medicare HMO | Admitting: Emergency Medicine

## 2021-02-14 ENCOUNTER — Other Ambulatory Visit: Payer: Self-pay

## 2021-02-14 ENCOUNTER — Ambulatory Visit (INDEPENDENT_AMBULATORY_CARE_PROVIDER_SITE_OTHER): Payer: Medicare HMO

## 2021-02-14 ENCOUNTER — Encounter: Payer: Self-pay | Admitting: Emergency Medicine

## 2021-02-14 DIAGNOSIS — R918 Other nonspecific abnormal finding of lung field: Secondary | ICD-10-CM | POA: Diagnosis not present

## 2021-02-14 DIAGNOSIS — J8489 Other specified interstitial pulmonary diseases: Secondary | ICD-10-CM

## 2021-02-14 DIAGNOSIS — K219 Gastro-esophageal reflux disease without esophagitis: Secondary | ICD-10-CM

## 2021-02-14 DIAGNOSIS — J4520 Mild intermittent asthma, uncomplicated: Secondary | ICD-10-CM | POA: Diagnosis not present

## 2021-02-14 DIAGNOSIS — J310 Chronic rhinitis: Secondary | ICD-10-CM

## 2021-02-14 NOTE — Assessment & Plan Note (Signed)
Continue Symbicort, albuterol as needed.  Continue to control her modifiers, GERD and rhinitis

## 2021-02-14 NOTE — Assessment & Plan Note (Signed)
Remote without recurrence, from 2007.  We are intermittently following chest x-rays, last was 2 years ago.  Performed today.

## 2021-02-14 NOTE — Patient Instructions (Addendum)
Please continue Symbicort 2 puffs twice a day.  Rinse and gargle after using. Keep your albuterol available to use 2 puffs if needed for shortness of breath, chest tightness, wheezing. Continue to use either your Nasonex or azelastine nasal spray if you need it for increased congestion Continue to use your Protonix if you need it for acid reflux or indigestion Chest x-ray today COVID-19 vaccine is up-to-date Follow with Dr. Lamonte Sakai in 12 months or sooner if you have any problems.

## 2021-02-14 NOTE — Progress Notes (Signed)
Subjective:     Patient ID: [name], female   DOB: [birth year], 80 y.o.   MRN: [id number]  HPI  ROV 02/15/20 --this a follow-up visit for 80, never smoker with a history of cryptogenic organizing pneumonia (remote, 2007), associated moderate obstructive lung disease/asthma, allergic rhinitis, intermittent GERD.She reports that she has been doing fairly well - she does still have exertional SOB when she pushes her garbage cans, climbs hills. She has albuterol, uses it once a month. She is on Symbicort - has been informed that it is no longer on her insurance and that we will need to change. No real cough or wheeze. Never needs nasonex. Rare GERD, uses protonix prn. She has had once flare since last time - was treated for acute sinusitis in January '21, treated w Augmentin.  She has had her COVID vaccines.   ROV 02/14/21 --follow-up visit for 80 year old year old never smoker with a remote history of COP, moderate obstruction due to presumed fixed asthma.  She also has allergic rhinitis, intermittent GERD.  We have been managing her on Symbicort. She reports today that her breathing has been ok, she still has exertional SOB with taking garbage to street, etc. Rare albuterol use. No cough or wheeze. No abx or pred since last time.  She is Protonix intermittently for GERD, Nasonex, astelin, as needed for nasal congestion.  COVID vaccine up to date.     Past Medical History:  Diagnosis Date  . Other acute sinusitis   . Other diseases of nasal cavity and sinuses(478.19)   . Pure hypercholesterolemia   . Unspecified asthma(493.90)     Past Surgical History:  Procedure Laterality Date  . CORNEAL TRANSPLANT    . 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 ROTATOR CUFF REPAIR     bilateral  . TONSILLECTOMY     twice    Current Outpatient Medications:  .  albuterol (VENTOLIN HFA) 108 (90 Base) MCG/ACT inhaler, Inhale 2 puffs into the lungs 4 (four) times daily as needed., Disp: , Rfl:  .  aspirin 81 MG  tablet, Take 81 mg by mouth daily., Disp: , Rfl:  .  atorvastatin (LIPITOR) 10 MG tablet, Take 10 mg by mouth daily., Disp: , Rfl:  .  azelastine (ASTEPRO) 0.1 % nasal spray, Place 1 spray into both nostrils 2 (two) times daily., Disp: 30 mL, Rfl: 4 .  budesonide-formoterol (SYMBICORT) 160-4.5 MCG/ACT inhaler, Inhale 2 puffs into the lungs 2 (two) times daily., Disp: , Rfl:  .  calcium-vitamin D (OSCAL) 250-125 MG-UNIT per tablet, Take 1 tablet by mouth daily., Disp: , Rfl:  .  Cholecalciferol (VITAMIN D) 50 MCG (2000 UT) tablet, 1 tablet, Disp: , Rfl:  .  dorzolamide (TRUSOPT) 2 % ophthalmic solution, INSTILL 1 DROP INTO BOTH EYES TWICE A DAY, Disp: , Rfl: 11 .  mometasone (NASONEX) 50 MCG/ACT nasal spray, Place 2 sprays into the nose daily as needed., Disp: , Rfl:  .  mupirocin ointment (BACTROBAN) 2 %, APPLY OINTMENT EXTERNALLY TO AFFECTED AREA THREE TIMES DAILY AS NEEDED, Disp: , Rfl:  .  Omega-3 Fatty Acids (FISH OIL) 1000 MG CAPS, Take 1 capsule by mouth daily., Disp: , Rfl:  .  pantoprazole (PROTONIX) 40 MG tablet, Take 40 mg by mouth daily., Disp: , Rfl:  .  rOPINIRole (REQUIP) 1 MG tablet, Take 1 mg by mouth at bedtime., Disp: , Rfl:  .  SYNTHROID 50 MCG tablet, TAKE 1 TABLET BY MOUTH ONCE EVERY MORNING ON  AN EMPTY STOMACH, Disp: , Rfl: 5 .  traZODone (DESYREL) 100 MG tablet, Take 100 mg by mouth at bedtime., Disp: , Rfl: 1   Review of Systems As per HPI   Objective:   Physical Exam Vitals:   02/14/21 0956  BP: 120/70  Pulse: 79  Temp: (!) 97.2 F (36.2 C)  TempSrc: Temporal  SpO2: 97%  Weight: 163 lb 3.2 oz (74 kg)  Height: 5\' 6"  (1.676 m)    Gen: Pleasant, well-nourished, in no distress,  normal affect  ENT: No lesions,  mouth clear,  oropharynx clear, no postnasal drip  Neck: No JVD, no stridor  Lungs: No use of accessory muscles, clear without rales or rhonchi  Cardiovascular: RRR, heart sounds normal, no murmur or gallops, no peripheral edema  Musculoskeletal:  No deformities, no cyanosis or clubbing  Neuro: alert, non focal  Skin: Warm, no lesions or rashes   Assessment:     BOOP (bronchiolitis obliterans with organizing pneumonia) Remote without recurrence, from 2007.  We are intermittently following chest x-rays, last was 2 years ago.  Performed today.  Asthma Continue Symbicort, albuterol as needed.  Continue to control her modifiers, GERD and rhinitis  Chronic rhinitis She has nasal steroid as well as azelastine available for as needed  GERD (gastroesophageal reflux disease) Protonix available as needed  Baltazar Apo, MD, PhD 02/14/2021, 10:28 AM [facility] [phone] or if no answer [phone]

## 2021-02-14 NOTE — Assessment & Plan Note (Signed)
She has nasal steroid as well as azelastine available for as needed

## 2021-02-14 NOTE — Addendum Note (Signed)
Addended by: Lorretta Harp on: 02/14/2021 10:31 AM   Modules accepted: Orders

## 2021-02-14 NOTE — Assessment & Plan Note (Signed)
Protonix available as needed

## 2021-02-15 DIAGNOSIS — E039 Hypothyroidism, unspecified: Secondary | ICD-10-CM | POA: Diagnosis not present

## 2021-03-19 ENCOUNTER — Encounter: Payer: Self-pay | Admitting: *Deleted

## 2021-03-26 ENCOUNTER — Other Ambulatory Visit: Payer: Self-pay | Admitting: Family Medicine

## 2021-03-26 DIAGNOSIS — E559 Vitamin D deficiency, unspecified: Secondary | ICD-10-CM | POA: Diagnosis not present

## 2021-03-26 DIAGNOSIS — G2581 Restless legs syndrome: Secondary | ICD-10-CM | POA: Diagnosis not present

## 2021-03-26 DIAGNOSIS — Z1231 Encounter for screening mammogram for malignant neoplasm of breast: Secondary | ICD-10-CM

## 2021-03-26 DIAGNOSIS — E039 Hypothyroidism, unspecified: Secondary | ICD-10-CM | POA: Diagnosis not present

## 2021-03-26 DIAGNOSIS — R5383 Other fatigue: Secondary | ICD-10-CM | POA: Diagnosis not present

## 2021-03-29 ENCOUNTER — Other Ambulatory Visit: Payer: Self-pay

## 2021-03-29 ENCOUNTER — Ambulatory Visit
Admission: RE | Admit: 2021-03-29 | Discharge: 2021-03-29 | Disposition: A | Discharge status: Home / Self Care | Payer: Medicare HMO | Source: Ambulatory Visit | Attending: Family Medicine | Admitting: Family Medicine

## 2021-03-29 DIAGNOSIS — N907 Vulvar cyst: Secondary | ICD-10-CM | POA: Diagnosis not present

## 2021-03-29 DIAGNOSIS — Z1231 Encounter for screening mammogram for malignant neoplasm of breast: Secondary | ICD-10-CM | POA: Diagnosis not present

## 2021-03-29 DIAGNOSIS — N898 Other specified noninflammatory disorders of vagina: Secondary | ICD-10-CM | POA: Diagnosis not present

## 2021-04-09 DIAGNOSIS — L820 Inflamed seborrheic keratosis: Secondary | ICD-10-CM | POA: Diagnosis not present

## 2021-04-09 DIAGNOSIS — B078 Other viral warts: Secondary | ICD-10-CM | POA: Diagnosis not present

## 2021-05-01 DIAGNOSIS — G4700 Insomnia, unspecified: Secondary | ICD-10-CM | POA: Diagnosis not present

## 2021-05-01 DIAGNOSIS — L03116 Cellulitis of left lower limb: Secondary | ICD-10-CM | POA: Diagnosis not present

## 2021-05-01 DIAGNOSIS — G2581 Restless legs syndrome: Secondary | ICD-10-CM | POA: Diagnosis not present

## 2021-05-01 DIAGNOSIS — E7800 Pure hypercholesterolemia, unspecified: Secondary | ICD-10-CM | POA: Diagnosis not present

## 2021-05-01 DIAGNOSIS — E039 Hypothyroidism, unspecified: Secondary | ICD-10-CM | POA: Diagnosis not present

## 2021-05-02 DIAGNOSIS — H401122 Primary open-angle glaucoma, left eye, moderate stage: Secondary | ICD-10-CM | POA: Diagnosis not present

## 2021-05-02 DIAGNOSIS — H401112 Primary open-angle glaucoma, right eye, moderate stage: Secondary | ICD-10-CM | POA: Diagnosis not present

## 2021-05-02 DIAGNOSIS — H401132 Primary open-angle glaucoma, bilateral, moderate stage: Secondary | ICD-10-CM | POA: Diagnosis not present

## 2021-07-05 DIAGNOSIS — S0990XA Unspecified injury of head, initial encounter: Secondary | ICD-10-CM | POA: Diagnosis not present

## 2021-07-14 ENCOUNTER — Other Ambulatory Visit: Payer: Self-pay | Admitting: Pulmonary Disease

## 2021-07-14 MED ORDER — PREDNISONE 20 MG PO TABS: 40.0000 mg | ORAL_TABLET | Freq: Every day | ORAL | 0 refills | Status: AC

## 2021-07-14 MED ORDER — DOXYCYCLINE HYCLATE 100 MG PO TABS: 100.0000 mg | ORAL_TABLET | Freq: Two times a day (BID) | ORAL | 0 refills | Status: DC

## 2021-07-14 NOTE — Telephone Encounter (Signed)
Received call from after-hours answering service.  Patient describes 2-3 nights of night sweats.  Feels like he is had a fever.  But has not checked.  Increased cough.  Wheezing.  Suspect asthma exacerbation.  However the description of night sweats, subjective fever concerning for possible superimposed mild pneumonia.  Prednisone 40 mg daily x5 days and doxycycline 100 mg twice daily x7 days sent to local pharmacy.  Instructed to contact office in the next 48 hours if she is not feeling improved.  If she worsens she should go to the emergency room.

## 2021-07-17 ENCOUNTER — Telehealth: Payer: Self-pay | Admitting: Emergency Medicine

## 2021-07-17 ENCOUNTER — Ambulatory Visit: Payer: Medicare HMO

## 2021-07-17 DIAGNOSIS — J8489 Other specified interstitial pulmonary diseases: Secondary | ICD-10-CM

## 2021-07-17 MED ORDER — BENZONATATE 100 MG PO CAPS: 100.0000 mg | ORAL_CAPSULE | Freq: Four times a day (QID) | ORAL | 1 refills | Status: DC | PRN

## 2021-07-17 NOTE — Telephone Encounter (Signed)
She needs to continue the doxy and pred to completion Use her symbicort - rinse and gargle after. Use albuterol as needed Try OTC cough suppression like delsym Send in tessalon '100mg'$  q6h prn cough, # 30 w 1RF She needs an OV w APP with a CXR

## 2021-07-17 NOTE — Telephone Encounter (Signed)
Called and spoke with Patient.  Patient stated she started wheezing Saturday night.  Patient stated she called the on call provider and was prescribed Doxycycline and Prednisone. Patient stated she started Doxycycline Sunday and Prednisone Monday.  Patient stated her wheeze has become a cough, mostly dry, but does have yellow sputum at times. Patient denies fever, chills, but is having fatigue.  Patient stated it feels like a bronchitis flare. Patient stated she has tried OTC cough medication, but felt it made her cough worse. Patient did have a home covid test Monday and it was negative.  Patient stated she uses [facility].  Message routed to Dr. Lamonte Sakai to advise

## 2021-07-17 NOTE — Telephone Encounter (Signed)
Called and spoke with patient. She verbalized understanding. She wishes to try the Tessalon perles. I have sent these to her pharmacy.   CXR has been ordered for next week. I was able to get her scheduled with RB on 07/23/21 at 1015am.   Nothing further needed at time of call.

## 2021-07-23 ENCOUNTER — Ambulatory Visit: Payer: Medicare HMO | Admitting: Emergency Medicine

## 2021-07-23 ENCOUNTER — Ambulatory Visit (INDEPENDENT_AMBULATORY_CARE_PROVIDER_SITE_OTHER): Payer: Medicare HMO

## 2021-07-23 DIAGNOSIS — R059 Cough, unspecified: Secondary | ICD-10-CM | POA: Diagnosis not present

## 2021-07-23 DIAGNOSIS — J8489 Other specified interstitial pulmonary diseases: Secondary | ICD-10-CM | POA: Diagnosis not present

## 2021-07-24 ENCOUNTER — Ambulatory Visit: Payer: Medicare HMO | Admitting: Emergency Medicine

## 2021-07-24 ENCOUNTER — Other Ambulatory Visit: Payer: Self-pay | Admitting: Emergency Medicine

## 2021-07-24 ENCOUNTER — Other Ambulatory Visit: Payer: Self-pay

## 2021-07-24 ENCOUNTER — Encounter: Payer: Self-pay | Admitting: Emergency Medicine

## 2021-07-24 VITALS — BP 120/80 | HR 100 | Temp 98.7°F | Ht 66.5 in | Wt 159.8 lb

## 2021-07-24 DIAGNOSIS — J0100 Acute maxillary sinusitis, unspecified: Secondary | ICD-10-CM | POA: Diagnosis not present

## 2021-07-24 DIAGNOSIS — J84116 Cryptogenic organizing pneumonia: Secondary | ICD-10-CM

## 2021-07-24 DIAGNOSIS — R059 Cough, unspecified: Secondary | ICD-10-CM

## 2021-07-24 DIAGNOSIS — J8489 Other specified interstitial pulmonary diseases: Secondary | ICD-10-CM | POA: Diagnosis not present

## 2021-07-24 DIAGNOSIS — J4520 Mild intermittent asthma, uncomplicated: Secondary | ICD-10-CM

## 2021-07-24 MED ORDER — MOMETASONE FUROATE 50 MCG/ACT NA SUSP: 2.0000 | Freq: Every day | NASAL | 2 refills | Status: DC | PRN

## 2021-07-24 MED ORDER — PREDNISONE 10 MG PO TABS: ORAL_TABLET | ORAL | 0 refills | Status: DC

## 2021-07-24 MED ORDER — AZELASTINE HCL 0.1 % NA SOLN: 1.0000 | Freq: Two times a day (BID) | NASAL | 4 refills | Status: DC

## 2021-07-24 NOTE — Progress Notes (Signed)
Subjective:     Patient ID: [name], female   DOB: 1941/07/23, 80 y.o.   MRN: [id number]  HPI  ROV 02/14/21 --follow-up visit for 80 year old never smoker with a remote history of COP, moderate obstruction due to presumed fixed asthma.  She also has allergic rhinitis, intermittent GERD.  We have been managing her on Symbicort. She reports today that her breathing has been ok, she still has exertional SOB with taking garbage to street, etc. Rare albuterol use. No cough or wheeze. No abx or pred since last time.  She is Protonix intermittently for GERD, Nasonex, astelin, as needed for nasal congestion.  COVID vaccine up to date.    ROV 07/24/21 --[name] is 41, never smoker with a remote history of COP there was diagnosed by right upper lobe resection in 2007, treated with corticosteroids.  She also has moderate obstruction due to presumed chronic asthma, chronic cough in the setting of allergic rhinitis and GERD.  We have been managing her on Symbicort.  She has albuterol which she uses approximately 1-2x a day, seems to help her breathing Has been on Nasonex nasal spray, has azelastine available to use if needed- has not been using.  Not currently on scheduled PPI, uses Protonix as needed  Increased cough beginning early September.  She describes this as starting with some wheezing that kept her awake, she developed some weakness and then cough, yellow mucous. Was treated w doxy + pred, later tessalon perles. Her home COVID was negative. Seemed to improve some, but not to baseline. Had some diarrhea w the abx, resolved. She finished the abx and pred on 07/21/21.  She still has some cough that is present after this episode. Mostly clear mucous sometimes yellow / green. She is having a lot of nasal gtt and mucous now. Denies any GERD sx.   Chest x-ray done 07/23/2021 to evaluate increased cough reviewed by me, shows some patchy bilateral airspace disease especially on the left as well as at the  right base, slightly progressed compared with her prior performed 02/14/2021   Review of Systems As per HPI   Objective:   Physical Exam Vitals:   07/24/21 1410  BP: 120/80  Pulse: 100  Temp: 98.7 F (37.1 C)  TempSrc: Oral  SpO2: 96%  Weight: 159 lb 12.8 oz (72.5 kg)  Height: 5' 6.5" (1.689 m)    Gen: Pleasant, well-nourished, in no distress,  normal affect  ENT: No lesions,  mouth clear,  oropharynx clear, no postnasal drip  Neck: No JVD, intermittent UA noise on insp  Lungs: No use of accessory muscles, clear without rales or rhonchi  Cardiovascular: RRR, heart sounds normal, no murmur or gallops, no peripheral edema  Musculoskeletal: No deformities, no cyanosis or clubbing  Neuro: alert, non focal  Skin: Warm, no lesions or rashes   Assessment:     Cough Persistent cough with a broad differential diagnosis of possible contributors.  Seemed to start with an infectious prodrome just over a week ago.  She may still be resolving/improving from this.  She was treated empirically with 5 days prednisone and doxycycline.  Consider contribution of her chronic rhinitis which is currently untreated.  Also consider possible recurrence of her COP, persistent infectious process, hypersensitivity pneumonitis, etc. given her more prominent interstitial changes on chest x-ray that was done yesterday.  Certainly also possible contribution of her known obstructive lung disease Will extend her prednisone taper, add back her steroid nasal spray, Astelin for her rhinitis.  We will repeat her CT scan of the chest to look for any progression, change in her chronic infiltrates that would be consistent with recurrence of cryptogenic organizing pneumonia.  Please take prednisone as directed until completely gone.  Restart your Nasonex nasal spray, 2 sprays each nostril once daily Try using your azelastine nasal spray, 2 sprays each nostril up to twice daily if you need it for nasal drainage  We  will repeat your CT chest to compare with your priors.  Follow with Dr Lamonte Sakai next available after your CT scan so that we can review the results together.    Asthma Use Symbicort 2 puffs twice a day.  Rinse and gargle after using Keep albuterol available to use 2 puffs up to every 4 hours if needed for shortness of breath, chest tightness, wheezing.  BOOP (bronchiolitis obliterans with organizing pneumonia) Remote dx, but with some progressive interstitial change on CXR 9/13 - needs repeat Ct chest to compare w priors.  Baltazar Apo, MD, PhD 07/24/2021, 2:41 PM [facility] [phone] or if no answer [phone]

## 2021-07-24 NOTE — Assessment & Plan Note (Signed)
Use Symbicort 2 puffs twice a day.  Rinse and gargle after using Keep albuterol available to use 2 puffs up to every 4 hours if needed for shortness of breath, chest tightness, wheezing.

## 2021-07-24 NOTE — Assessment & Plan Note (Signed)
Remote dx, but with some progressive interstitial change on CXR 9/13 - needs repeat Ct chest to compare w priors.

## 2021-07-24 NOTE — Assessment & Plan Note (Signed)
Persistent cough with a broad differential diagnosis of possible contributors.  Seemed to start with an infectious prodrome just over a week ago.  She may still be resolving/improving from this.  She was treated empirically with 5 days prednisone and doxycycline.  Consider contribution of her chronic rhinitis which is currently untreated.  Also consider possible recurrence of her COP, persistent infectious process, hypersensitivity pneumonitis, etc. given her more prominent interstitial changes on chest x-ray that was done yesterday.  Certainly also possible contribution of her known obstructive lung disease Will extend her prednisone taper, add back her steroid nasal spray, Astelin for her rhinitis.  We will repeat her CT scan of the chest to look for any progression, change in her chronic infiltrates that would be consistent with recurrence of cryptogenic organizing pneumonia.  Please take prednisone as directed until completely gone.  Restart your Nasonex nasal spray, 2 sprays each nostril once daily Try using your azelastine nasal spray, 2 sprays each nostril up to twice daily if you need it for nasal drainage  We will repeat your CT chest to compare with your priors.  Follow with Dr Lamonte Sakai next available after your CT scan so that we can review the results together.

## 2021-07-24 NOTE — Patient Instructions (Addendum)
Please take prednisone as directed until completely gone.  Restart your Nasonex nasal spray, 2 sprays each nostril once daily Try using your azelastine nasal spray, 2 sprays each nostril up to twice daily if you need it for nasal drainage Use Symbicort 2 puffs twice a day.  Rinse and gargle after using Keep albuterol available to use 2 puffs up to every 4 hours if needed for shortness of breath, chest tightness, wheezing.  We will repeat your CT chest to compare with your priors.  Follow with Dr Lamonte Sakai next available after your CT scan so that we can review the results together.

## 2021-08-03 ENCOUNTER — Ambulatory Visit
Admission: RE | Admit: 2021-08-03 | Discharge: 2021-08-03 | Disposition: A | Discharge status: Home / Self Care | Payer: Medicare HMO | Source: Ambulatory Visit | Attending: Emergency Medicine | Admitting: Emergency Medicine

## 2021-08-03 DIAGNOSIS — J189 Pneumonia, unspecified organism: Secondary | ICD-10-CM | POA: Diagnosis not present

## 2021-08-03 DIAGNOSIS — J84116 Cryptogenic organizing pneumonia: Secondary | ICD-10-CM

## 2021-08-03 DIAGNOSIS — R911 Solitary pulmonary nodule: Secondary | ICD-10-CM | POA: Diagnosis not present

## 2021-08-05 ENCOUNTER — Telehealth: Payer: Self-pay | Admitting: Emergency Medicine

## 2021-08-05 NOTE — Telephone Encounter (Signed)
Called and spoke with pt letting her know the info stated by RB and she verbalized understanding. Pt did not have an appt scheduled so I did schedule pt an appt tomorrow 9/27 at 4pm with RB for f/u after CT. Nothing further needed.

## 2021-08-05 NOTE — Telephone Encounter (Signed)
Please let the patient know that I reviewed her CT scan. There is no evidence for Bacterial Pneumonia or Cryptogenic Organizing Pneumonia (COP or BOOP). Great news. We can review further at her office visit.

## 2021-08-05 NOTE — Telephone Encounter (Signed)
I called and spoke with patient regarding message. Patient had CT done on 48/25/00 and our policy is to give providers 4 days to read and result and I informed patient of this. I will route this message to Dr. Lamonte Sakai and reach out once we hear back. Patient verbalized understanding.  Dr. Lamonte Sakai, please advise. Thanks!

## 2021-08-06 ENCOUNTER — Ambulatory Visit: Payer: Medicare HMO | Admitting: Emergency Medicine

## 2021-08-06 ENCOUNTER — Other Ambulatory Visit: Payer: Self-pay

## 2021-08-06 ENCOUNTER — Encounter: Payer: Self-pay | Admitting: Emergency Medicine

## 2021-08-06 VITALS — BP 114/64 | HR 83 | Temp 98.3°F | Ht 66.5 in | Wt 162.0 lb

## 2021-08-06 DIAGNOSIS — J310 Chronic rhinitis: Secondary | ICD-10-CM

## 2021-08-06 DIAGNOSIS — J8489 Other specified interstitial pulmonary diseases: Secondary | ICD-10-CM

## 2021-08-06 DIAGNOSIS — J441 Chronic obstructive pulmonary disease with (acute) exacerbation: Secondary | ICD-10-CM

## 2021-08-06 DIAGNOSIS — Z23 Encounter for immunization: Secondary | ICD-10-CM

## 2021-08-06 NOTE — Assessment & Plan Note (Signed)
I rechecked her CT chest when she was dealing with flaring coughing that did not seem to be responding quickly to standard therapy.  The CT was reassuring, no evidence of infiltrate, no evidence of recurrent COP

## 2021-08-06 NOTE — Patient Instructions (Signed)
Please continue Symbicort 2 puffs twice a day.  Rinse and gargle after using. Keep albuterol available to use 2 puffs up to every 4 hours if needed for shortness of breath, chest tightness, wheezing.  Try stopping your Astelin nasal spray.  If you do not have any worsening of nasal drainage or cough then you can stay off this medication, only use it as needed. You could consider trying to stop your Nasacort nasal spray as well if you are able to get off the Astelin Agree with getting the flu shot Agree with getting the updated COVID-19 booster shot. Follow with Dr. Lamonte Sakai in 12 months or sooner if you have any problems.

## 2021-08-06 NOTE — Assessment & Plan Note (Signed)
Please continue Symbicort 2 puffs twice a day.  Rinse and gargle after using. Keep albuterol available to use 2 puffs up to every 4 hours if needed for shortness of breath, chest tightness, wheezing.  Agree with getting the flu shot Agree with getting the updated COVID-19 booster shot. Follow with Dr. Lamonte Sakai in 12 months or sooner if you have any problems.

## 2021-08-06 NOTE — Assessment & Plan Note (Signed)
Try stopping your Astelin nasal spray.  If you do not have any worsening of nasal drainage or cough then you can stay off this medication, only use it as needed. You could consider trying to stop your Nasacort nasal spray as well if you are able to get off the Astelin

## 2021-08-06 NOTE — Progress Notes (Signed)
Subjective:     Patient ID: [name], female   DOB: 11-Apr-1941, 80 y.o.   MRN: [id number]  HPI  ROV 07/24/21 --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  She also has moderate obstruction due to presumed chronic asthma, chronic cough in the setting of allergic rhinitis and GERD.  We have been managing her on Symbicort.  She has albuterol which she uses approximately 1-2x a day, seems to help her breathing Has been on Nasonex nasal spray, has azelastine available to use if needed- has not been using.  Not currently on scheduled PPI, uses Protonix as needed  Increased cough beginning early September.  She describes this as starting with some wheezing that kept her awake, she developed some weakness and then cough, yellow mucous. Was treated w doxy + pred, later tessalon perles. Her home COVID was negative. Seemed to improve some, but not to baseline. Had some diarrhea w the abx, resolved. She finished the abx and pred on 07/21/21.  She still has some cough that is present after this episode. Mostly clear mucous sometimes yellow / green. She is having a lot of nasal gtt and mucous now. Denies any GERD sx.   Chest x-ray done 07/23/2021 to evaluate increased cough reviewed by me, shows some patchy bilateral airspace disease especially on the left as well as at the right base, slightly progressed compared with her prior performed 02/14/2021  ROV 08/06/21 --follow-up visit for 80 year old woman with a remote history of cryptogenic organizing pneumonia (diagnosed surgically), moderate obstructive lung disease, chronic cough with allergic rhinitis and GERD.  She has been managed on Symbicort.  I saw her earlier this month with flaring cough.  She was treated empirically with antibiotics and prednisone prior to that visit, I extended the course, added back her Nasacort nasal spray, Astelin nasal spray.  I  also repeated her CT scan of the chest to ensure no recurrence of COP as below. She is reliable w Symbicort, rare albuterol use.   CT scan of the chest performed 08/03/2021 reviewed by me, showed no evidence of recurrence of cryptogenic pneumonia, no infiltrates, stable left apical pulmonary nodule 4 mm, some left upper lobe bronchiectatic change, left basilar atelectasis.  Also noted to have enlarged and heterogeneous calcified bilateral thyroid glands.   Review of Systems As per HPI   Objective:   Physical Exam Vitals:   08/06/21 1604  BP: 114/64  Pulse: 83  Temp: 98.3 F (36.8 C)  TempSrc: Oral  SpO2: 96%  Weight: 162 lb (73.5 kg)  Height: 5' 6.5" (1.689 m)    Gen: Pleasant, well-nourished, in no distress,  normal affect  ENT: No lesions,  mouth clear,  oropharynx clear, no postnasal drip  Neck: No JVD, no stridor  Lungs: No use of accessory muscles, clear without rales or rhonchi  Cardiovascular: RRR, heart sounds normal, no murmur or gallops, no peripheral edema  Musculoskeletal: No deformities, no cyanosis or clubbing  Neuro: alert, non focal  Skin: Warm, no lesions or rashes   Assessment:     COPD (chronic obstructive pulmonary disease) (HCC) Please continue Symbicort 2 puffs twice a day.  Rinse and gargle after using. Keep albuterol available to use 2 puffs up to every 4 hours if needed for shortness of breath, chest tightness, wheezing.  Agree with getting the flu shot Agree with getting the updated COVID-19 booster shot. Follow with  Dr. Lamonte Sakai in 12 months or sooner if you have any problems.   Chronic rhinitis Try stopping your Astelin nasal spray.  If you do not have any worsening of nasal drainage or cough then you can stay off this medication, only use it as needed. You could consider trying to stop your Nasacort nasal spray as well if you are able to get off the Astelin  BOOP (bronchiolitis obliterans with organizing pneumonia) I rechecked her CT chest  when she was dealing with flaring coughing that did not seem to be responding quickly to standard therapy.  The CT was reassuring, no evidence of infiltrate, no evidence of recurrent COP                                                                                                                                                                                                                                                                                                                                                                                                                                                Baltazar Apo, MD, PhD 08/06/2021, 4:31 PM [facility] [phone] or if no answer [phone]

## 2021-09-23 DIAGNOSIS — H401132 Primary open-angle glaucoma, bilateral, moderate stage: Secondary | ICD-10-CM | POA: Diagnosis not present

## 2021-09-25 DIAGNOSIS — H9193 Unspecified hearing loss, bilateral: Secondary | ICD-10-CM | POA: Diagnosis not present

## 2021-09-25 DIAGNOSIS — H938X1 Other specified disorders of right ear: Secondary | ICD-10-CM | POA: Diagnosis not present

## 2021-09-25 DIAGNOSIS — L602 Onychogryphosis: Secondary | ICD-10-CM | POA: Diagnosis not present

## 2021-10-01 ENCOUNTER — Other Ambulatory Visit: Payer: Self-pay

## 2021-10-01 ENCOUNTER — Ambulatory Visit: Payer: Medicare HMO | Admitting: Podiatry

## 2021-10-01 DIAGNOSIS — M79676 Pain in unspecified toe(s): Secondary | ICD-10-CM | POA: Diagnosis not present

## 2021-10-01 DIAGNOSIS — B351 Tinea unguium: Secondary | ICD-10-CM

## 2021-10-01 DIAGNOSIS — M79609 Pain in unspecified limb: Secondary | ICD-10-CM

## 2021-10-01 NOTE — Progress Notes (Signed)
Car Subjective:  Patient ID: [name], female    DOB: Dec 23, 1940,  MRN: [id number] HPI Chief Complaint  Patient presents with   Nail Problem    Bilateral great toenail and 2nd toenail- thick discoloration- long overlapping nails    New Patient (Initial Visit)    80 y.o. female presents with the above complaint.   ROS: Denies fever chills nausea vomiting muscle aches pains calf pain back pain chest pain shortness of breath.  Past Medical History:  Diagnosis Date   Other acute sinusitis    Other diseases of nasal cavity and sinuses(478.19)    Pure hypercholesterolemia    Unspecified asthma(493.90)    Past Surgical History:  Procedure Laterality Date   CORNEAL TRANSPLANT     PNEUMONECTOMY     for BOOP Right 1/3   ROTATOR CUFF REPAIR     bilateral   TONSILLECTOMY     twice    Current Outpatient Medications:    albuterol (VENTOLIN HFA) 108 (90 Base) MCG/ACT inhaler, Inhale 2 puffs into the lungs 4 (four) times daily as needed., Disp: , Rfl:    aspirin 81 MG tablet, Take 81 mg by mouth daily., Disp: , Rfl:    atorvastatin (LIPITOR) 10 MG tablet, Take 10 mg by mouth daily., Disp: , Rfl:    azelastine (ASTEPRO) 0.1 % nasal spray, Place 1 spray into both nostrils 2 (two) times daily., Disp: 30 mL, Rfl: 4   benzonatate (TESSALON) 100 MG capsule, Take 1 capsule (100 mg total) by mouth every 6 (six) hours as needed for cough., Disp: 30 capsule, Rfl: 1   budesonide-formoterol (SYMBICORT) 160-4.5 MCG/ACT inhaler, Inhale 2 puffs into the lungs 2 (two) times daily., Disp: , Rfl:    calcium-vitamin D (OSCAL) 250-125 MG-UNIT per tablet, Take 1 tablet by mouth daily., Disp: , Rfl:    Cholecalciferol (VITAMIN D) 50 MCG (2000 UT) tablet, 1 tablet, Disp: , Rfl:    dorzolamide (TRUSOPT) 2 % ophthalmic solution, INSTILL 1 DROP INTO BOTH EYES TWICE A DAY, Disp: , Rfl: 11   doxycycline (VIBRA-TABS) 100 MG tablet, Take 1 tablet (100 mg total) by mouth 2 (two) times daily., Disp: 14 tablet, Rfl:  0   mometasone (NASONEX) 50 MCG/ACT nasal spray, Place 2 sprays into the nose daily as needed., Disp: 1 each, Rfl: 2   mupirocin ointment (BACTROBAN) 2 %, APPLY OINTMENT EXTERNALLY TO AFFECTED AREA THREE TIMES DAILY AS NEEDED, Disp: , Rfl:    Omega-3 Fatty Acids (FISH OIL) 1000 MG CAPS, Take 1 capsule by mouth daily., Disp: , Rfl:    pantoprazole (PROTONIX) 40 MG tablet, Take 40 mg by mouth daily., Disp: , Rfl:    predniSONE (DELTASONE) 10 MG tablet, Take 4 tablets X 3 days, 3 tabs X 3 days, 2 tabs x 3 days, 1 tab x 3 days, Disp: 30 tablet, Rfl: 0   rOPINIRole (REQUIP) 1 MG tablet, Take 1 mg by mouth at bedtime., Disp: , Rfl:    SYNTHROID 50 MCG tablet, TAKE 1 TABLET BY MOUTH ONCE EVERY MORNING ON AN EMPTY STOMACH, Disp: , Rfl: 5   traZODone (DESYREL) 100 MG tablet, Take 100 mg by mouth at bedtime., Disp: , Rfl: 1  Allergies  Allergen Reactions   Alphagan [Brimonidine] Itching and Other (See Comments)    RED   Brimonidine Tartrate     Other reaction(s): red eyes   Percocet [Oxycodone-Acetaminophen]     Rapid heart rate   Travoprost     INTOLERANT TO TRAVATAN -  eyes red and burning   Review of Systems Objective:  There were no vitals filed for this visit.  General: Well developed, nourished, in no acute distress, alert and oriented x3   Dermatological: Skin is warm, dry and supple bilateral. Nails x 10 are thick yellow dystrophic clinically mycotic particularly the hallux and second digit bilaterally with normal nail plates otherwise.; remaining integument appears unremarkable at this time. There are no open sores, no preulcerative lesions, no rash or signs of infection present.  Vascular: Dorsalis Pedis artery and Posterior Tibial artery pedal pulses are 2/4 bilateral with immedate capillary fill time. Pedal hair growth present. No varicosities and no lower extremity edema present bilateral.   Neruologic: Grossly intact via light touch bilateral. Vibratory intact via tuning fork  bilateral. Protective threshold with Semmes Wienstein monofilament intact to all pedal sites bilateral. Patellar and Achilles deep tendon reflexes 2+ bilateral. No Babinski or clonus noted bilateral.   Musculoskeletal: No gross boney pedal deformities bilateral. No pain, crepitus, or limitation noted with foot and ankle range of motion bilateral. Muscular strength 5/5 in all groups tested bilateral.  Gait: Unassisted, Nonantalgic.    Radiographs:  None taken  Assessment & Plan:   Assessment: Pain limb secondary to onychomycosis and nail dystrophy.  Plan: Debridement of toenails 1 through 5 bilateral.     Aydin Hink T. [place], [place]

## 2021-10-11 DIAGNOSIS — H52223 Regular astigmatism, bilateral: Secondary | ICD-10-CM | POA: Diagnosis not present

## 2021-10-11 DIAGNOSIS — H524 Presbyopia: Secondary | ICD-10-CM | POA: Diagnosis not present

## 2021-10-29 DIAGNOSIS — R058 Other specified cough: Secondary | ICD-10-CM | POA: Diagnosis not present

## 2021-10-29 DIAGNOSIS — R062 Wheezing: Secondary | ICD-10-CM | POA: Diagnosis not present

## 2021-11-18 DIAGNOSIS — G4700 Insomnia, unspecified: Secondary | ICD-10-CM | POA: Diagnosis not present

## 2021-11-18 DIAGNOSIS — Z1389 Encounter for screening for other disorder: Secondary | ICD-10-CM | POA: Diagnosis not present

## 2021-11-18 DIAGNOSIS — J4520 Mild intermittent asthma, uncomplicated: Secondary | ICD-10-CM | POA: Diagnosis not present

## 2021-11-18 DIAGNOSIS — E039 Hypothyroidism, unspecified: Secondary | ICD-10-CM | POA: Diagnosis not present

## 2021-11-18 DIAGNOSIS — M810 Age-related osteoporosis without current pathological fracture: Secondary | ICD-10-CM | POA: Diagnosis not present

## 2021-11-18 DIAGNOSIS — G2581 Restless legs syndrome: Secondary | ICD-10-CM | POA: Diagnosis not present

## 2021-11-18 DIAGNOSIS — Z0000 Encounter for general adult medical examination without abnormal findings: Secondary | ICD-10-CM | POA: Diagnosis not present

## 2021-11-18 DIAGNOSIS — E7800 Pure hypercholesterolemia, unspecified: Secondary | ICD-10-CM | POA: Diagnosis not present

## 2022-01-01 ENCOUNTER — Ambulatory Visit: Payer: Medicare HMO | Admitting: Podiatry

## 2022-01-13 ENCOUNTER — Ambulatory Visit: Payer: Medicare HMO | Admitting: Podiatry

## 2022-01-13 ENCOUNTER — Other Ambulatory Visit: Payer: Self-pay

## 2022-01-13 ENCOUNTER — Encounter: Payer: Self-pay | Admitting: Podiatry

## 2022-01-13 DIAGNOSIS — B351 Tinea unguium: Secondary | ICD-10-CM

## 2022-01-13 DIAGNOSIS — M79609 Pain in unspecified limb: Secondary | ICD-10-CM

## 2022-01-13 NOTE — Progress Notes (Signed)
This patient returns to the office for evaluation and treatment of long thick painful nails .  This patient is unable to trim her own nails since the patient cannot reach her feet.  Patient says the nails are painful walking and wearing his shoes.  She returns for preventive foot care services.  General Appearance  Alert, conversant and in no acute stress.  Vascular  Dorsalis pedis and posterior tibial  pulses are palpable  bilaterally.  Capillary return is within normal limits  bilaterally. Temperature is within normal limits  bilaterally.  Neurologic  Senn-Weinstein monofilament wire test within normal limits  bilaterally. Muscle power within normal limits bilaterally.  Nails Thick disfigured discolored nails with subungual debris  from hallux to third  toes bilaterally. No evidence of bacterial infection or drainage bilaterally.  Orthopedic  No limitations of motion  feet .  No crepitus or effusions noted.  No bony pathology or digital deformities noted.  Skin  normotropic skin with no porokeratosis noted bilaterally.  No signs of infections or ulcers noted.     Onychomycosis  Pain in toes right foot  Pain in toes left foot  Debridement  of nails  1-5  B/L with a nail nipper.  Nails were then filed using a dremel tool with no incidents.    RTC  10 weeks    Anajulia Leyendecker DPM   

## 2022-01-31 DIAGNOSIS — M25552 Pain in left hip: Secondary | ICD-10-CM | POA: Diagnosis not present

## 2022-01-31 DIAGNOSIS — M5136 Other intervertebral disc degeneration, lumbar region: Secondary | ICD-10-CM | POA: Diagnosis not present

## 2022-02-04 DIAGNOSIS — G8929 Other chronic pain: Secondary | ICD-10-CM | POA: Diagnosis not present

## 2022-02-04 DIAGNOSIS — R350 Frequency of micturition: Secondary | ICD-10-CM | POA: Diagnosis not present

## 2022-02-04 DIAGNOSIS — M545 Low back pain, unspecified: Secondary | ICD-10-CM | POA: Diagnosis not present

## 2022-02-13 DIAGNOSIS — M545 Low back pain, unspecified: Secondary | ICD-10-CM | POA: Diagnosis not present

## 2022-02-17 DIAGNOSIS — H401132 Primary open-angle glaucoma, bilateral, moderate stage: Secondary | ICD-10-CM | POA: Diagnosis not present

## 2022-02-17 DIAGNOSIS — H401122 Primary open-angle glaucoma, left eye, moderate stage: Secondary | ICD-10-CM | POA: Diagnosis not present

## 2022-02-17 DIAGNOSIS — H401112 Primary open-angle glaucoma, right eye, moderate stage: Secondary | ICD-10-CM | POA: Diagnosis not present

## 2022-02-19 DIAGNOSIS — M545 Low back pain, unspecified: Secondary | ICD-10-CM | POA: Diagnosis not present

## 2022-02-25 DIAGNOSIS — M545 Low back pain, unspecified: Secondary | ICD-10-CM | POA: Diagnosis not present

## 2022-03-04 DIAGNOSIS — M545 Low back pain, unspecified: Secondary | ICD-10-CM | POA: Diagnosis not present

## 2022-03-12 DIAGNOSIS — J441 Chronic obstructive pulmonary disease with (acute) exacerbation: Secondary | ICD-10-CM | POA: Diagnosis not present

## 2022-03-12 DIAGNOSIS — R058 Other specified cough: Secondary | ICD-10-CM | POA: Diagnosis not present

## 2022-03-24 ENCOUNTER — Encounter: Payer: Self-pay | Admitting: Podiatry

## 2022-03-24 ENCOUNTER — Ambulatory Visit: Payer: Medicare HMO | Admitting: Podiatry

## 2022-03-24 DIAGNOSIS — B351 Tinea unguium: Secondary | ICD-10-CM

## 2022-03-24 DIAGNOSIS — M79609 Pain in unspecified limb: Secondary | ICD-10-CM | POA: Diagnosis not present

## 2022-03-24 NOTE — Progress Notes (Signed)
This patient returns to the office for evaluation and treatment of long thick painful nails .  This patient is unable to trim her own nails since the patient cannot reach her feet.  Patient says the nails are painful walking and wearing his shoes.  She returns for preventive foot care services.  General Appearance  Alert, conversant and in no acute stress.  Vascular  Dorsalis pedis and posterior tibial  pulses are palpable  bilaterally.  Capillary return is within normal limits  bilaterally. Temperature is within normal limits  bilaterally.  Neurologic  Senn-Weinstein monofilament wire test within normal limits  bilaterally. Muscle power within normal limits bilaterally.  Nails Thick disfigured discolored nails with subungual debris  from hallux to third  toes bilaterally. No evidence of bacterial infection or drainage bilaterally.  Orthopedic  No limitations of motion  feet .  No crepitus or effusions noted.  No bony pathology or digital deformities noted.  Skin  normotropic skin with no porokeratosis noted bilaterally.  No signs of infections or ulcers noted.     Onychomycosis  Pain in toes right foot  Pain in toes left foot  Debridement  of nails  1-5  B/L with a nail nipper.  Nails were then filed using a dremel tool with no incidents.    RTC  10 weeks    [name] DPM   

## 2022-05-15 DIAGNOSIS — E7800 Pure hypercholesterolemia, unspecified: Secondary | ICD-10-CM | POA: Diagnosis not present

## 2022-05-15 DIAGNOSIS — G2581 Restless legs syndrome: Secondary | ICD-10-CM | POA: Diagnosis not present

## 2022-05-15 DIAGNOSIS — G4700 Insomnia, unspecified: Secondary | ICD-10-CM | POA: Diagnosis not present

## 2022-05-15 DIAGNOSIS — J449 Chronic obstructive pulmonary disease, unspecified: Secondary | ICD-10-CM | POA: Diagnosis not present

## 2022-05-15 DIAGNOSIS — E039 Hypothyroidism, unspecified: Secondary | ICD-10-CM | POA: Diagnosis not present

## 2022-05-15 DIAGNOSIS — J8489 Other specified interstitial pulmonary diseases: Secondary | ICD-10-CM | POA: Diagnosis not present

## 2022-05-15 DIAGNOSIS — K219 Gastro-esophageal reflux disease without esophagitis: Secondary | ICD-10-CM | POA: Diagnosis not present

## 2022-05-21 DIAGNOSIS — H6122 Impacted cerumen, left ear: Secondary | ICD-10-CM | POA: Diagnosis not present

## 2022-06-03 ENCOUNTER — Encounter: Payer: Self-pay | Admitting: Podiatry

## 2022-06-03 ENCOUNTER — Ambulatory Visit: Payer: Medicare HMO | Admitting: Podiatry

## 2022-06-03 DIAGNOSIS — B351 Tinea unguium: Secondary | ICD-10-CM | POA: Diagnosis not present

## 2022-06-03 DIAGNOSIS — M79675 Pain in left toe(s): Secondary | ICD-10-CM | POA: Diagnosis not present

## 2022-06-03 DIAGNOSIS — M79674 Pain in right toe(s): Secondary | ICD-10-CM | POA: Diagnosis not present

## 2022-06-03 DIAGNOSIS — B079 Viral wart, unspecified: Secondary | ICD-10-CM

## 2022-06-03 NOTE — Progress Notes (Signed)
This patient returns to the office for evaluation and treatment of long thick painful nails .  This patient is unable to trim her own nails since the patient cannot reach her feet.  Patient says the nails are painful walking and wearing his shoes.  She returns for preventive foot care services.  General Appearance  Alert, conversant and in no acute stress.  Vascular  Dorsalis pedis and posterior tibial  pulses are palpable  bilaterally.  Capillary return is within normal limits  bilaterally. Temperature is within normal limits  bilaterally.  Neurologic  Senn-Weinstein monofilament wire test within normal limits  bilaterally. Muscle power within normal limits bilaterally.  Nails Thick disfigured discolored nails with subungual debris  from hallux to third  toes bilaterally. No evidence of bacterial infection or drainage bilaterally.  Orthopedic  No limitations of motion  feet .  No crepitus or effusions noted.  No bony pathology or digital deformities noted.  Skin  normotropic skin with no porokeratosis noted bilaterally.  No signs of infections or ulcers noted.     Onychomycosis  Pain in toes right foot  Pain in toes left foot  Debridement  of nails  1-5  B/L with a nail nipper.  Nails were then filed using a dremel tool with no incidents.    RTC  10 weeks    Sherrika Weakland DPM   

## 2022-06-09 ENCOUNTER — Other Ambulatory Visit: Payer: Self-pay | Admitting: Family Medicine

## 2022-06-09 DIAGNOSIS — Z1231 Encounter for screening mammogram for malignant neoplasm of breast: Secondary | ICD-10-CM

## 2022-06-19 ENCOUNTER — Ambulatory Visit: Payer: Medicare HMO

## 2022-08-12 ENCOUNTER — Encounter: Payer: Self-pay | Admitting: Podiatry

## 2022-08-12 ENCOUNTER — Ambulatory Visit: Payer: Medicare HMO | Admitting: Podiatry

## 2022-08-12 DIAGNOSIS — M79609 Pain in unspecified limb: Secondary | ICD-10-CM

## 2022-08-12 DIAGNOSIS — B351 Tinea unguium: Secondary | ICD-10-CM | POA: Diagnosis not present

## 2022-08-12 NOTE — Progress Notes (Signed)
This patient returns to the office for evaluation and treatment of long thick painful nails .  This patient is unable to trim her own nails since the patient cannot reach her feet.  Patient says the nails are painful walking and wearing his shoes.  She returns for preventive foot care services.  General Appearance  Alert, conversant and in no acute stress.  Vascular  Dorsalis pedis and posterior tibial  pulses are palpable  bilaterally.  Capillary return is within normal limits  bilaterally. Temperature is within normal limits  bilaterally.  Neurologic  Senn-Weinstein monofilament wire test within normal limits  bilaterally. Muscle power within normal limits bilaterally.  Nails Thick disfigured discolored nails with subungual debris  from hallux to third  toes bilaterally. No evidence of bacterial infection or drainage bilaterally.  Orthopedic  No limitations of motion  feet .  No crepitus or effusions noted.  No bony pathology or digital deformities noted.  Skin  normotropic skin with no porokeratosis noted bilaterally.  No signs of infections or ulcers noted.     Onychomycosis  Pain in toes right foot  Pain in toes left foot  Debridement  of nails  1-5  B/L with a nail nipper.  Nails were then filed using a dremel tool with no incidents.    RTC  10 weeks    Marlos Carmen DPM   

## 2022-08-16 ENCOUNTER — Encounter (HOSPITAL_BASED_OUTPATIENT_CLINIC_OR_DEPARTMENT_OTHER): Payer: Self-pay

## 2022-08-16 ENCOUNTER — Other Ambulatory Visit: Payer: Self-pay

## 2022-08-16 ENCOUNTER — Emergency Department (HOSPITAL_BASED_OUTPATIENT_CLINIC_OR_DEPARTMENT_OTHER): Payer: Medicare HMO

## 2022-08-16 ENCOUNTER — Emergency Department (HOSPITAL_BASED_OUTPATIENT_CLINIC_OR_DEPARTMENT_OTHER)
Admission: EM | Admit: 2022-08-16 | Discharge: 2022-08-17 | Disposition: A | Discharge status: Home / Self Care | Payer: Medicare HMO | Attending: Emergency Medicine | Admitting: Emergency Medicine

## 2022-08-16 DIAGNOSIS — K5730 Diverticulosis of large intestine without perforation or abscess without bleeding: Secondary | ICD-10-CM | POA: Insufficient documentation

## 2022-08-16 DIAGNOSIS — I609 Nontraumatic subarachnoid hemorrhage, unspecified: Secondary | ICD-10-CM

## 2022-08-16 DIAGNOSIS — E010 Iodine-deficiency related diffuse (endemic) goiter: Secondary | ICD-10-CM

## 2022-08-16 DIAGNOSIS — S20212A Contusion of left front wall of thorax, initial encounter: Secondary | ICD-10-CM

## 2022-08-16 DIAGNOSIS — I10 Essential (primary) hypertension: Secondary | ICD-10-CM | POA: Diagnosis not present

## 2022-08-16 DIAGNOSIS — G4489 Other headache syndrome: Secondary | ICD-10-CM | POA: Diagnosis not present

## 2022-08-16 DIAGNOSIS — S0990XA Unspecified injury of head, initial encounter: Secondary | ICD-10-CM | POA: Diagnosis present

## 2022-08-16 DIAGNOSIS — T1490XA Injury, unspecified, initial encounter: Secondary | ICD-10-CM | POA: Diagnosis not present

## 2022-08-16 DIAGNOSIS — J479 Bronchiectasis, uncomplicated: Secondary | ICD-10-CM | POA: Diagnosis not present

## 2022-08-16 DIAGNOSIS — Y92410 Unspecified street and highway as the place of occurrence of the external cause: Secondary | ICD-10-CM | POA: Diagnosis not present

## 2022-08-16 DIAGNOSIS — S065X0A Traumatic subdural hemorrhage without loss of consciousness, initial encounter: Secondary | ICD-10-CM | POA: Diagnosis not present

## 2022-08-16 DIAGNOSIS — Z7951 Long term (current) use of inhaled steroids: Secondary | ICD-10-CM | POA: Diagnosis not present

## 2022-08-16 DIAGNOSIS — S066X0A Traumatic subarachnoid hemorrhage without loss of consciousness, initial encounter: Secondary | ICD-10-CM | POA: Insufficient documentation

## 2022-08-16 DIAGNOSIS — Z041 Encounter for examination and observation following transport accident: Secondary | ICD-10-CM | POA: Diagnosis not present

## 2022-08-16 DIAGNOSIS — J449 Chronic obstructive pulmonary disease, unspecified: Secondary | ICD-10-CM | POA: Insufficient documentation

## 2022-08-16 DIAGNOSIS — Z7982 Long term (current) use of aspirin: Secondary | ICD-10-CM | POA: Insufficient documentation

## 2022-08-16 DIAGNOSIS — I700 Atherosclerosis of aorta: Secondary | ICD-10-CM | POA: Insufficient documentation

## 2022-08-16 DIAGNOSIS — J9811 Atelectasis: Secondary | ICD-10-CM | POA: Diagnosis not present

## 2022-08-16 DIAGNOSIS — R519 Headache, unspecified: Secondary | ICD-10-CM | POA: Diagnosis not present

## 2022-08-16 LAB — CBC WITH DIFFERENTIAL/PLATELET
Abs Immature Granulocytes: 0.02 10*3/uL (ref 0.00–0.07)
Basophils Absolute: 0.1 10*3/uL (ref 0.0–0.1)
Basophils Relative: 1 %
Eosinophils Absolute: 0.1 10*3/uL (ref 0.0–0.5)
Eosinophils Relative: 1 %
HCT: 36 % (ref 36.0–46.0)
Hemoglobin: 12 g/dL (ref 12.0–15.0)
Immature Granulocytes: 0 %
Lymphocytes Relative: 25 %
Lymphs Abs: 1.7 10*3/uL (ref 0.7–4.0)
MCH: 30.1 pg (ref 26.0–34.0)
MCHC: 33.3 g/dL (ref 30.0–36.0)
MCV: 90.2 fL (ref 80.0–100.0)
Monocytes Absolute: 0.9 10*3/uL (ref 0.1–1.0)
Monocytes Relative: 13 %
Neutro Abs: 4.2 10*3/uL (ref 1.7–7.7)
Neutrophils Relative %: 60 %
Platelets: 201 10*3/uL (ref 150–400)
RBC: 3.99 MIL/uL (ref 3.87–5.11)
RDW: 12.9 % (ref 11.5–15.5)
WBC: 6.9 10*3/uL (ref 4.0–10.5)
nRBC: 0 % (ref 0.0–0.2)

## 2022-08-16 LAB — BASIC METABOLIC PANEL
Anion gap: 7 (ref 5–15)
BUN: 18 mg/dL (ref 8–23)
CO2: 28 mmol/L (ref 22–32)
Calcium: 9.1 mg/dL (ref 8.9–10.3)
Chloride: 104 mmol/L (ref 98–111)
Creatinine, Ser: 0.91 mg/dL (ref 0.44–1.00)
GFR, Estimated: >60 mL/min (ref >60)
Glucose, Bld: 126 mg/dL — ABNORMAL HIGH (ref 70–99)
Potassium: 3.8 mmol/L (ref 3.5–5.1)
Sodium: 139 mmol/L (ref 135–145)

## 2022-08-16 LAB — URINALYSIS, ROUTINE W REFLEX MICROSCOPIC
Bilirubin Urine: NEGATIVE
Glucose, UA: NEGATIVE mg/dL
Hgb urine dipstick: NEGATIVE
Ketones, ur: NEGATIVE mg/dL
Leukocytes,Ua: NEGATIVE
Nitrite: NEGATIVE
Protein, ur: NEGATIVE mg/dL
Specific Gravity, Urine: 1.015 (ref 1.005–1.030)
pH: 6 (ref 5.0–8.0)

## 2022-08-16 LAB — TSH: TSH: 0.549 u[IU]/mL (ref 0.350–4.500)

## 2022-08-16 MED ORDER — SILVER SULFADIAZINE 1 % EX CREA
TOPICAL_CREAM | Freq: Once | CUTANEOUS | Status: AC
  Administered 2022-08-16: 1 via TOPICAL
  Filled 2022-08-16: qty 85

## 2022-08-16 MED ORDER — ONDANSETRON HCL 4 MG/2ML IJ SOLN
4.0000 mg | Freq: Once | INTRAMUSCULAR | Status: AC
  Administered 2022-08-16: 4 mg via INTRAVENOUS
  Filled 2022-08-16: qty 2

## 2022-08-16 MED ORDER — MORPHINE SULFATE (PF) 4 MG/ML IV SOLN
4.0000 mg | Freq: Once | INTRAVENOUS | Status: AC
  Administered 2022-08-16: 4 mg via INTRAVENOUS
  Filled 2022-08-16: qty 1

## 2022-08-16 MED ORDER — HYDROCODONE-ACETAMINOPHEN 5-325 MG PO TABS: 1.0000 | ORAL_TABLET | ORAL | 0 refills | Status: DC | PRN

## 2022-08-16 MED ORDER — HYDROCODONE-ACETAMINOPHEN 5-325 MG PO TABS
1.0000 | ORAL_TABLET | Freq: Once | ORAL | Status: AC
  Administered 2022-08-17: 1 via ORAL
  Filled 2022-08-16: qty 1

## 2022-08-16 MED ORDER — IOHEXOL 300 MG/ML  SOLN
100.0000 mL | Freq: Once | INTRAMUSCULAR | Status: AC | PRN
  Administered 2022-08-16: 100 mL via INTRAVENOUS

## 2022-08-16 NOTE — Discharge Instructions (Addendum)
Outpatient f/u on thyroid
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
## 2022-08-17 ENCOUNTER — Encounter (HOSPITAL_BASED_OUTPATIENT_CLINIC_OR_DEPARTMENT_OTHER): Payer: Self-pay | Admitting: Radiology

## 2022-08-17 ENCOUNTER — Emergency Department (HOSPITAL_BASED_OUTPATIENT_CLINIC_OR_DEPARTMENT_OTHER): Payer: Medicare HMO

## 2022-08-17 ENCOUNTER — Encounter (HOSPITAL_COMMUNITY): Payer: Self-pay

## 2022-08-17 ENCOUNTER — Emergency Department (HOSPITAL_COMMUNITY): Payer: Medicare HMO

## 2022-08-17 DIAGNOSIS — Y92410 Unspecified street and highway as the place of occurrence of the external cause: Secondary | ICD-10-CM | POA: Diagnosis not present

## 2022-08-17 DIAGNOSIS — Z7951 Long term (current) use of inhaled steroids: Secondary | ICD-10-CM | POA: Diagnosis not present

## 2022-08-17 DIAGNOSIS — S0990XA Unspecified injury of head, initial encounter: Secondary | ICD-10-CM | POA: Diagnosis present

## 2022-08-17 DIAGNOSIS — Z7982 Long term (current) use of aspirin: Secondary | ICD-10-CM | POA: Diagnosis not present

## 2022-08-17 DIAGNOSIS — S066X0A Traumatic subarachnoid hemorrhage without loss of consciousness, initial encounter: Secondary | ICD-10-CM | POA: Diagnosis not present

## 2022-08-17 DIAGNOSIS — I6523 Occlusion and stenosis of bilateral carotid arteries: Secondary | ICD-10-CM | POA: Diagnosis not present

## 2022-08-17 DIAGNOSIS — E010 Iodine-deficiency related diffuse (endemic) goiter: Secondary | ICD-10-CM | POA: Diagnosis not present

## 2022-08-17 DIAGNOSIS — J449 Chronic obstructive pulmonary disease, unspecified: Secondary | ICD-10-CM | POA: Diagnosis not present

## 2022-08-17 DIAGNOSIS — R519 Headache, unspecified: Secondary | ICD-10-CM | POA: Diagnosis not present

## 2022-08-17 DIAGNOSIS — S20212A Contusion of left front wall of thorax, initial encounter: Secondary | ICD-10-CM | POA: Diagnosis not present

## 2022-08-17 MED ORDER — ONDANSETRON HCL 4 MG PO TABS: 4.0000 mg | ORAL_TABLET | Freq: Three times a day (TID) | ORAL | 0 refills | Status: DC | PRN

## 2022-08-17 MED ORDER — ONDANSETRON HCL 4 MG/2ML IJ SOLN
4.0000 mg | Freq: Once | INTRAMUSCULAR | Status: AC
  Administered 2022-08-17: 4 mg via INTRAVENOUS
  Filled 2022-08-17: qty 2

## 2022-08-17 MED ORDER — MORPHINE SULFATE (PF) 2 MG/ML IV SOLN
2.0000 mg | Freq: Once | INTRAVENOUS | Status: AC
  Administered 2022-08-17: 2 mg via INTRAVENOUS
  Filled 2022-08-17: qty 1

## 2022-08-17 MED ORDER — MORPHINE SULFATE (PF) 4 MG/ML IV SOLN
4.0000 mg | Freq: Once | INTRAVENOUS | Status: AC
  Administered 2022-08-17: 4 mg via INTRAVENOUS
  Filled 2022-08-17: qty 1

## 2022-08-17 MED ORDER — IOHEXOL 350 MG/ML SOLN
75.0000 mL | Freq: Once | INTRAVENOUS | Status: AC | PRN
  Administered 2022-08-17: 75 mL via INTRAVENOUS

## 2022-08-17 NOTE — ED Notes (Signed)
[facility] picking up pt at this time. Pt with [facility] for transport.

## 2022-08-17 NOTE — ED Notes (Signed)
Patient called out about her belongings. Reached out to [facility] to inquire. Per [facility] patient's daughter has her belongs. Updated patient with information.
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
## 2022-08-17 NOTE — ED Notes (Signed)
Patient transported to CT 

## 2022-08-17 NOTE — ED Provider Notes (Signed)
EKG Interpretation  Date/Time:  Sunday August 17 2022 06:21:38 EDT Ventricular Rate:  68 PR Interval:  131 QRS Duration: 97 QT Interval:  409 QTC Calculation: 435 R Axis:   2 Text Interpretation: Sinus rhythm Left atrial enlargement RSR' in V1 or V2, probably normal variant Interpretation limited secondary to artifact Confirmed by Ripley Fraise [phone]) on 08/17/2022 6:26:22 AM       Patient transferred from med center for evaluation for subarachnoid and subdural hematoma.  Patient seen by [facility] hospitalist who feel patient requires ICU level of care.  Patient is reporting headache, though blood pressure is appropriate.  Discussed with [facility] with neurosurgery.  They will evaluate the patient in the emergency department   Ripley Fraise, MD 08/17/22 [phone]
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
## 2022-08-17 NOTE — ED Notes (Signed)
Provided pt with crackers and ginger ale for PO challenge
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
## 2022-08-17 NOTE — ED Notes (Signed)
Spoke with Otila Kluver at [place]. Will be an ER to ER transfer. Dr. Christy Gentles is accepting physician.
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
## 2022-08-17 NOTE — ED Notes (Signed)
Dr. Annie Paras at bedside at this time

## 2022-08-20 DIAGNOSIS — S066X0A Traumatic subarachnoid hemorrhage without loss of consciousness, initial encounter: Secondary | ICD-10-CM | POA: Diagnosis not present

## 2022-08-21 DIAGNOSIS — R0789 Other chest pain: Secondary | ICD-10-CM | POA: Diagnosis not present

## 2022-08-21 DIAGNOSIS — J40 Bronchitis, not specified as acute or chronic: Secondary | ICD-10-CM | POA: Diagnosis not present

## 2022-08-21 DIAGNOSIS — S0990XA Unspecified injury of head, initial encounter: Secondary | ICD-10-CM | POA: Diagnosis not present

## 2022-08-21 DIAGNOSIS — I609 Nontraumatic subarachnoid hemorrhage, unspecified: Secondary | ICD-10-CM | POA: Diagnosis not present

## 2022-08-25 ENCOUNTER — Telehealth: Payer: Self-pay

## 2022-08-25 NOTE — Telephone Encounter (Signed)
     Patient  visit on 10/8  at [facility]   Have you been able to follow up with your primary care physician? yes  The patient was or was not able to obtain any needed medicine or equipment.yes  Are there diet recommendations that you are having difficulty following?na  Patient expresses understanding of discharge instructions and education provided has no other needs at this time.  yes    [place], Care Management  [phone] [address], [place], [place] [address] Phone: [phone] Email: [email or web address]

## 2022-08-28 DIAGNOSIS — Z0289 Encounter for other administrative examinations: Secondary | ICD-10-CM | POA: Diagnosis not present

## 2022-09-01 DIAGNOSIS — S20219D Contusion of unspecified front wall of thorax, subsequent encounter: Secondary | ICD-10-CM | POA: Diagnosis not present

## 2022-09-15 DIAGNOSIS — M62830 Muscle spasm of back: Secondary | ICD-10-CM | POA: Diagnosis not present

## 2022-09-15 DIAGNOSIS — M5432 Sciatica, left side: Secondary | ICD-10-CM | POA: Diagnosis not present

## 2022-09-17 ENCOUNTER — Encounter: Payer: Self-pay | Admitting: Emergency Medicine

## 2022-09-17 ENCOUNTER — Ambulatory Visit: Payer: Medicare HMO | Admitting: Emergency Medicine

## 2022-09-17 VITALS — BP 118/68 | HR 81 | Temp 98.7°F | Ht 66.0 in | Wt 146.2 lb

## 2022-09-17 DIAGNOSIS — J310 Chronic rhinitis: Secondary | ICD-10-CM | POA: Diagnosis not present

## 2022-09-17 DIAGNOSIS — J8489 Other specified interstitial pulmonary diseases: Secondary | ICD-10-CM

## 2022-09-17 DIAGNOSIS — J441 Chronic obstructive pulmonary disease with (acute) exacerbation: Secondary | ICD-10-CM | POA: Diagnosis not present
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
## 2022-09-17 NOTE — Assessment & Plan Note (Signed)
We will not restart any of your nasal sprays at this time. Please call our office if you develop any new respiratory symptoms especially worsening cough.
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
## 2022-09-17 NOTE — Patient Instructions (Signed)
Please continue Symbicort 2 puffs twice a day.  Rinse and gargle after using. Keep albuterol available to use 2 puffs up to every 4 hours if needed for shortness of breath, chest tightness, wheezing.  We will not restart any of your nasal sprays at this time. Please call our office if you develop any new respiratory symptoms especially worsening cough. Follow with [name] in 12 months or sooner if you have any problems.

## 2022-09-17 NOTE — Assessment & Plan Note (Signed)
Please continue Symbicort 2 puffs twice a day.  Rinse and gargle after using. Keep albuterol available to use 2 puffs up to every 4 hours if needed for shortness of breath, chest tightness, wheezing.  Follow with Dr. Lamonte Sakai in 12 months or sooner if you have any problems.

## 2022-09-18 DIAGNOSIS — M79605 Pain in left leg: Secondary | ICD-10-CM | POA: Diagnosis not present

## 2022-09-18 DIAGNOSIS — M542 Cervicalgia: Secondary | ICD-10-CM | POA: Diagnosis not present

## 2022-09-18 DIAGNOSIS — M5459 Other low back pain: Secondary | ICD-10-CM | POA: Diagnosis not present

## 2022-09-23 DIAGNOSIS — M542 Cervicalgia: Secondary | ICD-10-CM | POA: Diagnosis not present

## 2022-09-23 DIAGNOSIS — M79605 Pain in left leg: Secondary | ICD-10-CM | POA: Diagnosis not present

## 2022-09-23 DIAGNOSIS — M5459 Other low back pain: Secondary | ICD-10-CM | POA: Diagnosis not present

## 2022-09-25 DIAGNOSIS — M79605 Pain in left leg: Secondary | ICD-10-CM | POA: Diagnosis not present

## 2022-09-25 DIAGNOSIS — M542 Cervicalgia: Secondary | ICD-10-CM | POA: Diagnosis not present

## 2022-09-25 DIAGNOSIS — M5459 Other low back pain: Secondary | ICD-10-CM | POA: Diagnosis not present

## 2022-09-30 DIAGNOSIS — M542 Cervicalgia: Secondary | ICD-10-CM | POA: Diagnosis not present

## 2022-09-30 DIAGNOSIS — M5459 Other low back pain: Secondary | ICD-10-CM | POA: Diagnosis not present

## 2022-09-30 DIAGNOSIS — M79605 Pain in left leg: Secondary | ICD-10-CM | POA: Diagnosis not present

## 2022-10-03 ENCOUNTER — Telehealth: Payer: Self-pay | Admitting: Emergency Medicine

## 2022-10-03 MED ORDER — PREDNISONE 10 MG PO TABS: ORAL_TABLET | ORAL | 0 refills | Status: DC

## 2022-10-03 MED ORDER — AMOXICILLIN-POT CLAVULANATE 875-125 MG PO TABS: 1.0000 | ORAL_TABLET | Freq: Two times a day (BID) | ORAL | 0 refills | Status: DC

## 2022-10-03 NOTE — Telephone Encounter (Signed)
Prescription for prednisone and augmentin sent to [facility] on [address]. Would head to ED if develop shortness of breath that isn't relieved with frequent use of rescue inhaler.
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
## 2022-10-07 DIAGNOSIS — M542 Cervicalgia: Secondary | ICD-10-CM | POA: Diagnosis not present

## 2022-10-07 DIAGNOSIS — M79605 Pain in left leg: Secondary | ICD-10-CM | POA: Diagnosis not present

## 2022-10-07 DIAGNOSIS — M5459 Other low back pain: Secondary | ICD-10-CM | POA: Diagnosis not present

## 2022-10-09 DIAGNOSIS — M79605 Pain in left leg: Secondary | ICD-10-CM | POA: Diagnosis not present

## 2022-10-09 DIAGNOSIS — M5459 Other low back pain: Secondary | ICD-10-CM | POA: Diagnosis not present

## 2022-10-09 DIAGNOSIS — M542 Cervicalgia: Secondary | ICD-10-CM | POA: Diagnosis not present

## 2022-10-10 DIAGNOSIS — U071 COVID-19: Secondary | ICD-10-CM | POA: Diagnosis not present

## 2022-10-21 ENCOUNTER — Ambulatory Visit: Payer: Medicare HMO | Admitting: Podiatry

## 2022-10-21 ENCOUNTER — Encounter: Payer: Self-pay | Admitting: Podiatry

## 2022-10-21 DIAGNOSIS — M79609 Pain in unspecified limb: Secondary | ICD-10-CM

## 2022-10-21 DIAGNOSIS — M5459 Other low back pain: Secondary | ICD-10-CM | POA: Diagnosis not present

## 2022-10-21 DIAGNOSIS — M542 Cervicalgia: Secondary | ICD-10-CM | POA: Diagnosis not present

## 2022-10-21 DIAGNOSIS — B351 Tinea unguium: Secondary | ICD-10-CM

## 2022-10-21 DIAGNOSIS — M79605 Pain in left leg: Secondary | ICD-10-CM | POA: Diagnosis not present

## 2022-10-21 NOTE — Progress Notes (Signed)
This patient returns to the office for evaluation and treatment of long thick painful nails .  This patient is unable to trim her own nails since the patient cannot reach her feet.  Patient says the nails are painful walking and wearing his shoes.  She returns for preventive foot care services.  General Appearance  Alert, conversant and in no acute stress.  Vascular  Dorsalis pedis and posterior tibial  pulses are palpable  bilaterally.  Capillary return is within normal limits  bilaterally. Temperature is within normal limits  bilaterally.  Neurologic  Senn-Weinstein monofilament wire test within normal limits  bilaterally. Muscle power within normal limits bilaterally.  Nails Thick disfigured discolored nails with subungual debris  from hallux to third  toes bilaterally. No evidence of bacterial infection or drainage bilaterally.  Orthopedic  No limitations of motion  feet .  No crepitus or effusions noted.  No bony pathology or digital deformities noted.  Skin  normotropic skin with no porokeratosis noted bilaterally.  No signs of infections or ulcers noted.     Onychomycosis  Pain in toes right foot  Pain in toes left foot  Debridement  of nails  1-5  B/L with a nail nipper.  Nails were then filed using a dremel tool with no incidents.    RTC  10 weeks    [name] DPM   

## 2022-10-23 DIAGNOSIS — M542 Cervicalgia: Secondary | ICD-10-CM | POA: Diagnosis not present

## 2022-10-23 DIAGNOSIS — M79605 Pain in left leg: Secondary | ICD-10-CM | POA: Diagnosis not present

## 2022-10-23 DIAGNOSIS — M5459 Other low back pain: Secondary | ICD-10-CM | POA: Diagnosis not present

## 2022-10-30 DIAGNOSIS — M5459 Other low back pain: Secondary | ICD-10-CM | POA: Diagnosis not present

## 2022-10-30 DIAGNOSIS — M542 Cervicalgia: Secondary | ICD-10-CM | POA: Diagnosis not present

## 2022-10-30 DIAGNOSIS — M79605 Pain in left leg: Secondary | ICD-10-CM | POA: Diagnosis not present

## 2022-11-04 DIAGNOSIS — M79605 Pain in left leg: Secondary | ICD-10-CM | POA: Diagnosis not present

## 2022-11-04 DIAGNOSIS — M542 Cervicalgia: Secondary | ICD-10-CM | POA: Diagnosis not present

## 2022-11-04 DIAGNOSIS — M5459 Other low back pain: Secondary | ICD-10-CM | POA: Diagnosis not present

## 2022-11-06 DIAGNOSIS — M542 Cervicalgia: Secondary | ICD-10-CM | POA: Diagnosis not present

## 2022-11-06 DIAGNOSIS — M5459 Other low back pain: Secondary | ICD-10-CM | POA: Diagnosis not present

## 2022-11-06 DIAGNOSIS — M79605 Pain in left leg: Secondary | ICD-10-CM | POA: Diagnosis not present

## 2022-11-17 DIAGNOSIS — H401122 Primary open-angle glaucoma, left eye, moderate stage: Secondary | ICD-10-CM | POA: Diagnosis not present

## 2022-11-17 DIAGNOSIS — H401112 Primary open-angle glaucoma, right eye, moderate stage: Secondary | ICD-10-CM | POA: Diagnosis not present

## 2022-11-17 DIAGNOSIS — H401132 Primary open-angle glaucoma, bilateral, moderate stage: Secondary | ICD-10-CM | POA: Diagnosis not present

## 2022-11-20 DIAGNOSIS — M542 Cervicalgia: Secondary | ICD-10-CM | POA: Diagnosis not present

## 2022-11-20 DIAGNOSIS — M5459 Other low back pain: Secondary | ICD-10-CM | POA: Diagnosis not present

## 2022-11-20 DIAGNOSIS — M79605 Pain in left leg: Secondary | ICD-10-CM | POA: Diagnosis not present

## 2022-11-25 DIAGNOSIS — M542 Cervicalgia: Secondary | ICD-10-CM | POA: Diagnosis not present

## 2022-11-25 DIAGNOSIS — M5459 Other low back pain: Secondary | ICD-10-CM | POA: Diagnosis not present

## 2022-11-25 DIAGNOSIS — M79605 Pain in left leg: Secondary | ICD-10-CM | POA: Diagnosis not present

## 2022-11-28 DIAGNOSIS — M79605 Pain in left leg: Secondary | ICD-10-CM | POA: Diagnosis not present

## 2022-11-28 DIAGNOSIS — M542 Cervicalgia: Secondary | ICD-10-CM | POA: Diagnosis not present

## 2022-11-28 DIAGNOSIS — M5459 Other low back pain: Secondary | ICD-10-CM | POA: Diagnosis not present

## 2022-12-02 DIAGNOSIS — M542 Cervicalgia: Secondary | ICD-10-CM | POA: Diagnosis not present

## 2022-12-02 DIAGNOSIS — M79605 Pain in left leg: Secondary | ICD-10-CM | POA: Diagnosis not present

## 2022-12-02 DIAGNOSIS — M5459 Other low back pain: Secondary | ICD-10-CM | POA: Diagnosis not present

## 2022-12-04 DIAGNOSIS — M79605 Pain in left leg: Secondary | ICD-10-CM | POA: Diagnosis not present

## 2022-12-04 DIAGNOSIS — M5459 Other low back pain: Secondary | ICD-10-CM | POA: Diagnosis not present

## 2022-12-04 DIAGNOSIS — M542 Cervicalgia: Secondary | ICD-10-CM | POA: Diagnosis not present

## 2022-12-11 DIAGNOSIS — M79605 Pain in left leg: Secondary | ICD-10-CM | POA: Diagnosis not present

## 2022-12-11 DIAGNOSIS — M542 Cervicalgia: Secondary | ICD-10-CM | POA: Diagnosis not present

## 2022-12-11 DIAGNOSIS — M5459 Other low back pain: Secondary | ICD-10-CM | POA: Diagnosis not present

## 2022-12-15 DIAGNOSIS — G2581 Restless legs syndrome: Secondary | ICD-10-CM | POA: Diagnosis not present

## 2022-12-15 DIAGNOSIS — E2839 Other primary ovarian failure: Secondary | ICD-10-CM | POA: Diagnosis not present

## 2022-12-15 DIAGNOSIS — E039 Hypothyroidism, unspecified: Secondary | ICD-10-CM | POA: Diagnosis not present

## 2022-12-15 DIAGNOSIS — E559 Vitamin D deficiency, unspecified: Secondary | ICD-10-CM | POA: Diagnosis not present

## 2022-12-15 DIAGNOSIS — M810 Age-related osteoporosis without current pathological fracture: Secondary | ICD-10-CM | POA: Diagnosis not present

## 2022-12-15 DIAGNOSIS — Z0000 Encounter for general adult medical examination without abnormal findings: Secondary | ICD-10-CM | POA: Diagnosis not present

## 2022-12-15 DIAGNOSIS — Z1331 Encounter for screening for depression: Secondary | ICD-10-CM | POA: Diagnosis not present

## 2022-12-15 DIAGNOSIS — E7800 Pure hypercholesterolemia, unspecified: Secondary | ICD-10-CM | POA: Diagnosis not present

## 2022-12-15 DIAGNOSIS — J449 Chronic obstructive pulmonary disease, unspecified: Secondary | ICD-10-CM | POA: Diagnosis not present

## 2022-12-16 ENCOUNTER — Other Ambulatory Visit: Payer: Self-pay | Admitting: Family Medicine

## 2022-12-16 DIAGNOSIS — Z1231 Encounter for screening mammogram for malignant neoplasm of breast: Secondary | ICD-10-CM

## 2022-12-16 DIAGNOSIS — E2839 Other primary ovarian failure: Secondary | ICD-10-CM

## 2022-12-16 DIAGNOSIS — M542 Cervicalgia: Secondary | ICD-10-CM | POA: Diagnosis not present

## 2022-12-16 DIAGNOSIS — M8580 Other specified disorders of bone density and structure, unspecified site: Secondary | ICD-10-CM

## 2022-12-16 DIAGNOSIS — M79605 Pain in left leg: Secondary | ICD-10-CM | POA: Diagnosis not present

## 2022-12-16 DIAGNOSIS — M5459 Other low back pain: Secondary | ICD-10-CM | POA: Diagnosis not present

## 2022-12-18 DIAGNOSIS — M542 Cervicalgia: Secondary | ICD-10-CM | POA: Diagnosis not present

## 2022-12-18 DIAGNOSIS — M5459 Other low back pain: Secondary | ICD-10-CM | POA: Diagnosis not present

## 2022-12-18 DIAGNOSIS — M79605 Pain in left leg: Secondary | ICD-10-CM | POA: Diagnosis not present

## 2022-12-22 ENCOUNTER — Ambulatory Visit
Admission: RE | Admit: 2022-12-22 | Discharge: 2022-12-22 | Disposition: A | Discharge status: Home / Self Care | Payer: Medicare HMO | Source: Ambulatory Visit | Attending: Family Medicine | Admitting: Family Medicine

## 2022-12-22 DIAGNOSIS — M810 Age-related osteoporosis without current pathological fracture: Secondary | ICD-10-CM | POA: Diagnosis not present

## 2022-12-22 DIAGNOSIS — M8580 Other specified disorders of bone density and structure, unspecified site: Secondary | ICD-10-CM

## 2022-12-22 DIAGNOSIS — M8589 Other specified disorders of bone density and structure, multiple sites: Secondary | ICD-10-CM | POA: Diagnosis not present

## 2022-12-22 DIAGNOSIS — E2839 Other primary ovarian failure: Secondary | ICD-10-CM

## 2022-12-22 DIAGNOSIS — Z780 Asymptomatic menopausal state: Secondary | ICD-10-CM | POA: Diagnosis not present

## 2022-12-23 DIAGNOSIS — M542 Cervicalgia: Secondary | ICD-10-CM | POA: Diagnosis not present

## 2022-12-23 DIAGNOSIS — M79605 Pain in left leg: Secondary | ICD-10-CM | POA: Diagnosis not present

## 2022-12-23 DIAGNOSIS — M5459 Other low back pain: Secondary | ICD-10-CM | POA: Diagnosis not present

## 2022-12-25 DIAGNOSIS — M5459 Other low back pain: Secondary | ICD-10-CM | POA: Diagnosis not present

## 2022-12-25 DIAGNOSIS — M79605 Pain in left leg: Secondary | ICD-10-CM | POA: Diagnosis not present

## 2022-12-25 DIAGNOSIS — M542 Cervicalgia: Secondary | ICD-10-CM | POA: Diagnosis not present

## 2022-12-30 DIAGNOSIS — M5459 Other low back pain: Secondary | ICD-10-CM | POA: Diagnosis not present

## 2022-12-30 DIAGNOSIS — M79605 Pain in left leg: Secondary | ICD-10-CM | POA: Diagnosis not present

## 2022-12-30 DIAGNOSIS — M542 Cervicalgia: Secondary | ICD-10-CM | POA: Diagnosis not present

## 2023-01-01 DIAGNOSIS — M79605 Pain in left leg: Secondary | ICD-10-CM | POA: Diagnosis not present

## 2023-01-01 DIAGNOSIS — M5459 Other low back pain: Secondary | ICD-10-CM | POA: Diagnosis not present

## 2023-01-01 DIAGNOSIS — M542 Cervicalgia: Secondary | ICD-10-CM | POA: Diagnosis not present

## 2023-01-12 ENCOUNTER — Ambulatory Visit: Payer: Medicare HMO | Admitting: Podiatry

## 2023-01-12 ENCOUNTER — Encounter: Payer: Self-pay | Admitting: Podiatry

## 2023-01-12 DIAGNOSIS — M79609 Pain in unspecified limb: Secondary | ICD-10-CM | POA: Diagnosis not present

## 2023-01-12 DIAGNOSIS — B351 Tinea unguium: Secondary | ICD-10-CM

## 2023-01-12 NOTE — Progress Notes (Signed)
This patient returns to the office for evaluation and treatment of long thick painful nails .  This patient is unable to trim her own nails since the patient cannot reach her feet.  Patient says the nails are painful walking and wearing his shoes.  She returns for preventive foot care services.  General Appearance  Alert, conversant and in no acute stress.  Vascular  Dorsalis pedis and posterior tibial  pulses are palpable  bilaterally.  Capillary return is within normal limits  bilaterally. Temperature is within normal limits  bilaterally.  Neurologic  Senn-Weinstein monofilament wire test within normal limits  bilaterally. Muscle power within normal limits bilaterally.  Nails Thick disfigured discolored nails with subungual debris  from hallux to third  toes bilaterally. No evidence of bacterial infection or drainage bilaterally.  Orthopedic  No limitations of motion  feet .  No crepitus or effusions noted.  No bony pathology or digital deformities noted.  Skin  normotropic skin with no porokeratosis noted bilaterally.  No signs of infections or ulcers noted.     Onychomycosis  Pain in toes right foot  Pain in toes left foot  Debridement  of nails  1-5  B/L with a nail nipper.  Nails were then filed using a dremel tool with no incidents.    RTC  10 weeks    Gardiner Barefoot DPM

## 2023-01-20 ENCOUNTER — Ambulatory Visit: Payer: Medicare HMO | Admitting: Podiatry

## 2023-01-21 DIAGNOSIS — M79605 Pain in left leg: Secondary | ICD-10-CM | POA: Diagnosis not present

## 2023-01-21 DIAGNOSIS — M542 Cervicalgia: Secondary | ICD-10-CM | POA: Diagnosis not present

## 2023-01-21 DIAGNOSIS — M5459 Other low back pain: Secondary | ICD-10-CM | POA: Diagnosis not present

## 2023-01-23 DIAGNOSIS — M542 Cervicalgia: Secondary | ICD-10-CM | POA: Diagnosis not present

## 2023-01-23 DIAGNOSIS — M79605 Pain in left leg: Secondary | ICD-10-CM | POA: Diagnosis not present

## 2023-01-23 DIAGNOSIS — M5459 Other low back pain: Secondary | ICD-10-CM | POA: Diagnosis not present

## 2023-01-29 DIAGNOSIS — M5459 Other low back pain: Secondary | ICD-10-CM | POA: Diagnosis not present

## 2023-01-29 DIAGNOSIS — M542 Cervicalgia: Secondary | ICD-10-CM | POA: Diagnosis not present

## 2023-01-29 DIAGNOSIS — M79605 Pain in left leg: Secondary | ICD-10-CM | POA: Diagnosis not present

## 2023-02-03 DIAGNOSIS — M542 Cervicalgia: Secondary | ICD-10-CM | POA: Diagnosis not present

## 2023-02-03 DIAGNOSIS — M5459 Other low back pain: Secondary | ICD-10-CM | POA: Diagnosis not present

## 2023-02-03 DIAGNOSIS — M79605 Pain in left leg: Secondary | ICD-10-CM | POA: Diagnosis not present

## 2023-02-04 ENCOUNTER — Ambulatory Visit
Admission: RE | Admit: 2023-02-04 | Discharge: 2023-02-04 | Disposition: A | Discharge status: Home / Self Care | Payer: Medicare HMO | Source: Ambulatory Visit | Attending: Family Medicine | Admitting: Family Medicine

## 2023-02-04 DIAGNOSIS — Z1231 Encounter for screening mammogram for malignant neoplasm of breast: Secondary | ICD-10-CM | POA: Diagnosis not present

## 2023-02-05 DIAGNOSIS — M5459 Other low back pain: Secondary | ICD-10-CM | POA: Diagnosis not present

## 2023-02-05 DIAGNOSIS — M542 Cervicalgia: Secondary | ICD-10-CM | POA: Diagnosis not present

## 2023-02-05 DIAGNOSIS — M79605 Pain in left leg: Secondary | ICD-10-CM | POA: Diagnosis not present

## 2023-02-10 DIAGNOSIS — E559 Vitamin D deficiency, unspecified: Secondary | ICD-10-CM | POA: Diagnosis not present

## 2023-02-10 DIAGNOSIS — M79605 Pain in left leg: Secondary | ICD-10-CM | POA: Diagnosis not present

## 2023-02-10 DIAGNOSIS — M5459 Other low back pain: Secondary | ICD-10-CM | POA: Diagnosis not present

## 2023-02-10 DIAGNOSIS — M542 Cervicalgia: Secondary | ICD-10-CM | POA: Diagnosis not present

## 2023-02-10 DIAGNOSIS — E039 Hypothyroidism, unspecified: Secondary | ICD-10-CM | POA: Diagnosis not present

## 2023-02-12 DIAGNOSIS — M79605 Pain in left leg: Secondary | ICD-10-CM | POA: Diagnosis not present

## 2023-02-12 DIAGNOSIS — M542 Cervicalgia: Secondary | ICD-10-CM | POA: Diagnosis not present

## 2023-02-12 DIAGNOSIS — M5459 Other low back pain: Secondary | ICD-10-CM | POA: Diagnosis not present

## 2023-02-18 DIAGNOSIS — M5459 Other low back pain: Secondary | ICD-10-CM | POA: Diagnosis not present

## 2023-02-18 DIAGNOSIS — M79605 Pain in left leg: Secondary | ICD-10-CM | POA: Diagnosis not present

## 2023-02-18 DIAGNOSIS — M542 Cervicalgia: Secondary | ICD-10-CM | POA: Diagnosis not present

## 2023-02-26 DIAGNOSIS — M5459 Other low back pain: Secondary | ICD-10-CM | POA: Diagnosis not present

## 2023-02-26 DIAGNOSIS — M542 Cervicalgia: Secondary | ICD-10-CM | POA: Diagnosis not present

## 2023-02-26 DIAGNOSIS — M79605 Pain in left leg: Secondary | ICD-10-CM | POA: Diagnosis not present

## 2023-03-03 DIAGNOSIS — M542 Cervicalgia: Secondary | ICD-10-CM | POA: Diagnosis not present

## 2023-03-03 DIAGNOSIS — M79605 Pain in left leg: Secondary | ICD-10-CM | POA: Diagnosis not present

## 2023-03-03 DIAGNOSIS — M5459 Other low back pain: Secondary | ICD-10-CM | POA: Diagnosis not present

## 2023-03-12 DIAGNOSIS — M79605 Pain in left leg: Secondary | ICD-10-CM | POA: Diagnosis not present

## 2023-03-12 DIAGNOSIS — M542 Cervicalgia: Secondary | ICD-10-CM | POA: Diagnosis not present

## 2023-03-12 DIAGNOSIS — M5459 Other low back pain: Secondary | ICD-10-CM | POA: Diagnosis not present

## 2023-03-17 ENCOUNTER — Other Ambulatory Visit (HOSPITAL_BASED_OUTPATIENT_CLINIC_OR_DEPARTMENT_OTHER): Payer: Self-pay | Admitting: Family Medicine

## 2023-03-17 ENCOUNTER — Ambulatory Visit (HOSPITAL_BASED_OUTPATIENT_CLINIC_OR_DEPARTMENT_OTHER)
Admission: RE | Admit: 2023-03-17 | Discharge: 2023-03-17 | Disposition: A | Discharge status: Home / Self Care | Payer: Medicare HMO | Source: Ambulatory Visit | Attending: Family Medicine | Admitting: Family Medicine

## 2023-03-17 DIAGNOSIS — R519 Headache, unspecified: Secondary | ICD-10-CM | POA: Diagnosis not present

## 2023-03-17 DIAGNOSIS — M549 Dorsalgia, unspecified: Secondary | ICD-10-CM | POA: Diagnosis not present

## 2023-03-19 DIAGNOSIS — M79605 Pain in left leg: Secondary | ICD-10-CM | POA: Diagnosis not present

## 2023-03-19 DIAGNOSIS — M5459 Other low back pain: Secondary | ICD-10-CM | POA: Diagnosis not present

## 2023-03-19 DIAGNOSIS — M542 Cervicalgia: Secondary | ICD-10-CM | POA: Diagnosis not present

## 2023-03-24 ENCOUNTER — Ambulatory Visit: Payer: Medicare HMO | Admitting: Podiatry

## 2023-03-24 ENCOUNTER — Encounter: Payer: Self-pay | Admitting: Podiatry

## 2023-03-24 DIAGNOSIS — B351 Tinea unguium: Secondary | ICD-10-CM | POA: Diagnosis not present

## 2023-03-24 DIAGNOSIS — M79609 Pain in unspecified limb: Secondary | ICD-10-CM | POA: Diagnosis not present

## 2023-03-24 NOTE — Progress Notes (Signed)
This patient returns to the office for evaluation and treatment of long thick painful nails .  This patient is unable to trim her own nails since the patient cannot reach her feet.  Patient says the nails are painful walking and wearing his shoes.  She returns for preventive foot care services.  General Appearance  Alert, conversant and in no acute stress.  Vascular  Dorsalis pedis and posterior tibial  pulses are palpable  bilaterally.  Capillary return is within normal limits  bilaterally. Temperature is within normal limits  bilaterally.  Neurologic  Senn-Weinstein monofilament wire test within normal limits  bilaterally. Muscle power within normal limits bilaterally.  Nails Thick disfigured discolored nails with subungual debris  from hallux to third  toes bilaterally. No evidence of bacterial infection or drainage bilaterally.  Orthopedic  No limitations of motion  feet .  No crepitus or effusions noted.  No bony pathology or digital deformities noted.  Skin  normotropic skin with no porokeratosis noted bilaterally.  No signs of infections or ulcers noted.     Onychomycosis  Pain in toes right foot  Pain in toes left foot  Debridement  of nails  1-5  B/L with a nail nipper.  Nails were then filed using a dremel tool with no incidents.    RTC  10 weeks    Dalphine Cowie DPM   

## 2023-03-26 DIAGNOSIS — M79605 Pain in left leg: Secondary | ICD-10-CM | POA: Diagnosis not present

## 2023-03-26 DIAGNOSIS — M542 Cervicalgia: Secondary | ICD-10-CM | POA: Diagnosis not present

## 2023-03-26 DIAGNOSIS — M5459 Other low back pain: Secondary | ICD-10-CM | POA: Diagnosis not present

## 2023-03-27 DIAGNOSIS — M79605 Pain in left leg: Secondary | ICD-10-CM | POA: Diagnosis not present

## 2023-03-27 DIAGNOSIS — M5459 Other low back pain: Secondary | ICD-10-CM | POA: Diagnosis not present

## 2023-03-27 DIAGNOSIS — M542 Cervicalgia: Secondary | ICD-10-CM | POA: Diagnosis not present

## 2023-03-30 DIAGNOSIS — M5459 Other low back pain: Secondary | ICD-10-CM | POA: Diagnosis not present

## 2023-03-30 DIAGNOSIS — M79605 Pain in left leg: Secondary | ICD-10-CM | POA: Diagnosis not present

## 2023-03-30 DIAGNOSIS — M542 Cervicalgia: Secondary | ICD-10-CM | POA: Diagnosis not present

## 2023-04-03 DIAGNOSIS — M79605 Pain in left leg: Secondary | ICD-10-CM | POA: Diagnosis not present

## 2023-04-03 DIAGNOSIS — M542 Cervicalgia: Secondary | ICD-10-CM | POA: Diagnosis not present

## 2023-04-03 DIAGNOSIS — M5459 Other low back pain: Secondary | ICD-10-CM | POA: Diagnosis not present

## 2023-04-07 DIAGNOSIS — M79605 Pain in left leg: Secondary | ICD-10-CM | POA: Diagnosis not present

## 2023-04-07 DIAGNOSIS — M542 Cervicalgia: Secondary | ICD-10-CM | POA: Diagnosis not present

## 2023-04-07 DIAGNOSIS — M5459 Other low back pain: Secondary | ICD-10-CM | POA: Diagnosis not present
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
## 2023-04-09 DIAGNOSIS — M79605 Pain in left leg: Secondary | ICD-10-CM | POA: Diagnosis not present

## 2023-04-09 DIAGNOSIS — M542 Cervicalgia: Secondary | ICD-10-CM | POA: Diagnosis not present

## 2023-04-09 DIAGNOSIS — M5459 Other low back pain: Secondary | ICD-10-CM | POA: Diagnosis not present

## 2023-04-13 DIAGNOSIS — M542 Cervicalgia: Secondary | ICD-10-CM | POA: Diagnosis not present

## 2023-04-13 DIAGNOSIS — M79605 Pain in left leg: Secondary | ICD-10-CM | POA: Diagnosis not present

## 2023-04-13 DIAGNOSIS — M546 Pain in thoracic spine: Secondary | ICD-10-CM | POA: Diagnosis not present

## 2023-04-13 DIAGNOSIS — M5459 Other low back pain: Secondary | ICD-10-CM | POA: Diagnosis not present

## 2023-04-13 DIAGNOSIS — R519 Headache, unspecified: Secondary | ICD-10-CM | POA: Diagnosis not present

## 2023-04-16 DIAGNOSIS — M5459 Other low back pain: Secondary | ICD-10-CM | POA: Diagnosis not present

## 2023-04-16 DIAGNOSIS — M79605 Pain in left leg: Secondary | ICD-10-CM | POA: Diagnosis not present

## 2023-04-16 DIAGNOSIS — M542 Cervicalgia: Secondary | ICD-10-CM | POA: Diagnosis not present

## 2023-04-28 DIAGNOSIS — M5459 Other low back pain: Secondary | ICD-10-CM | POA: Diagnosis not present

## 2023-04-28 DIAGNOSIS — M79605 Pain in left leg: Secondary | ICD-10-CM | POA: Diagnosis not present

## 2023-04-28 DIAGNOSIS — M542 Cervicalgia: Secondary | ICD-10-CM | POA: Diagnosis not present

## 2023-04-30 DIAGNOSIS — M79605 Pain in left leg: Secondary | ICD-10-CM | POA: Diagnosis not present

## 2023-04-30 DIAGNOSIS — M5459 Other low back pain: Secondary | ICD-10-CM | POA: Diagnosis not present

## 2023-04-30 DIAGNOSIS — M542 Cervicalgia: Secondary | ICD-10-CM | POA: Diagnosis not present

## 2023-05-03 ENCOUNTER — Telehealth: Payer: Self-pay | Admitting: Pulmonary Disease

## 2023-05-03 MED ORDER — PREDNISONE 20 MG PO TABS: ORAL_TABLET | ORAL | 0 refills | Status: AC

## 2023-05-03 NOTE — Telephone Encounter (Signed)
Received a call on after hours line.  History of organizing pneumonia.  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  Symptoms sound most consistent with asthma although with recurrent exacerbations.  Patient of Dr. Delton Coombes.  Increased cough and congestion.  Does not complain of worsening dyspnea or shortness of breath.  After specifically and she thinks well may be after direct questioning.  She request prednisone.  40 mg for 5 days then 20 mg for 5 days sent to pharmacy of choice in [place] as she is "out of town."

## 2023-05-06 ENCOUNTER — Ambulatory Visit (INDEPENDENT_AMBULATORY_CARE_PROVIDER_SITE_OTHER): Payer: Medicare HMO

## 2023-05-06 ENCOUNTER — Ambulatory Visit: Payer: Medicare HMO | Admitting: Internal Medicine

## 2023-05-06 ENCOUNTER — Encounter: Payer: Self-pay | Admitting: Internal Medicine

## 2023-05-06 VITALS — BP 120/72 | HR 89 | Temp 98.9°F | Ht 66.0 in | Wt 156.0 lb

## 2023-05-06 DIAGNOSIS — J441 Chronic obstructive pulmonary disease with (acute) exacerbation: Secondary | ICD-10-CM

## 2023-05-06 MED ORDER — IPRATROPIUM-ALBUTEROL 0.5-2.5 (3) MG/3ML IN SOLN
3.0000 mL | RESPIRATORY_TRACT | Status: AC
Start: 2023-05-06 — End: 2023-05-06
  Administered 2023-05-06: 3 mL via RESPIRATORY_TRACT

## 2023-05-06 NOTE — Patient Instructions (Addendum)
Please follow up with Dr. Delton Coombes in 3 months. Sooner if breathing not improving.   Your chest xray today shows no pneumonia.  Finish the prednisone as prescribed.  Take albuterol 4 times daily to help with the wheezing.   For nasal congestion can use nasal saline spray.
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
## 2023-05-11 ENCOUNTER — Telehealth: Payer: Self-pay | Admitting: Internal Medicine

## 2023-05-11 MED ORDER — AZITHROMYCIN 250 MG PO TABS: 250.0000 mg | ORAL_TABLET | Freq: Every day | ORAL | 0 refills | Status: DC

## 2023-05-11 NOTE — Telephone Encounter (Signed)
Called patient to review chest xray results. Still wheezing and coughing up mucus. Less short of breath. Will call abx azithromycin to pharmacy.

## 2023-06-01 DIAGNOSIS — M5459 Other low back pain: Secondary | ICD-10-CM | POA: Diagnosis not present

## 2023-06-01 DIAGNOSIS — M79605 Pain in left leg: Secondary | ICD-10-CM | POA: Diagnosis not present

## 2023-06-01 DIAGNOSIS — M542 Cervicalgia: Secondary | ICD-10-CM | POA: Diagnosis not present

## 2023-06-02 ENCOUNTER — Ambulatory Visit: Payer: Medicare HMO | Admitting: Podiatry

## 2023-06-05 DIAGNOSIS — M542 Cervicalgia: Secondary | ICD-10-CM | POA: Diagnosis not present

## 2023-06-05 DIAGNOSIS — M5459 Other low back pain: Secondary | ICD-10-CM | POA: Diagnosis not present

## 2023-06-05 DIAGNOSIS — M79605 Pain in left leg: Secondary | ICD-10-CM | POA: Diagnosis not present

## 2023-06-10 ENCOUNTER — Ambulatory Visit: Payer: Medicare HMO | Admitting: Podiatry

## 2023-06-10 ENCOUNTER — Encounter: Payer: Self-pay | Admitting: Podiatry

## 2023-06-10 DIAGNOSIS — M542 Cervicalgia: Secondary | ICD-10-CM | POA: Diagnosis not present

## 2023-06-10 DIAGNOSIS — M79609 Pain in unspecified limb: Secondary | ICD-10-CM | POA: Diagnosis not present

## 2023-06-10 DIAGNOSIS — B351 Tinea unguium: Secondary | ICD-10-CM | POA: Diagnosis not present

## 2023-06-10 DIAGNOSIS — M5459 Other low back pain: Secondary | ICD-10-CM | POA: Diagnosis not present

## 2023-06-10 DIAGNOSIS — M79605 Pain in left leg: Secondary | ICD-10-CM | POA: Diagnosis not present

## 2023-06-10 NOTE — Progress Notes (Signed)
This patient returns to the office for evaluation and treatment of long thick painful nails .  This patient is unable to trim her own nails since the patient cannot reach her feet.  Patient says the nails are painful walking and wearing his shoes.  She returns for preventive foot care services.  General Appearance  Alert, conversant and in no acute stress.  Vascular  Dorsalis pedis and posterior tibial  pulses are  weakly palpable  bilaterally.  Capillary return is within normal limits  bilaterally. Temperature is within normal limits  bilaterally.  Neurologic  Senn-Weinstein monofilament wire test within normal limits  bilaterally. Muscle power within normal limits bilaterally.  Nails Thick disfigured discolored nails with subungual debris  from hallux to third  toes bilaterally. No evidence of bacterial infection or drainage bilaterally.  Orthopedic  No limitations of motion  feet .  No crepitus or effusions noted.  No bony pathology or digital deformities noted.  Skin  normotropic skin with no porokeratosis noted bilaterally.  No signs of infections or ulcers noted.     Onychomycosis  Pain in toes right foot  Pain in toes left foot  Debridement  of nails  1-5  B/L with a nail nipper.  Nails were then filed using a dremel tool with no incidents.    RTC  10 weeks    Helane Gunther DPM

## 2023-06-12 ENCOUNTER — Ambulatory Visit: Payer: Medicare HMO | Admitting: Psychiatry

## 2023-06-12 DIAGNOSIS — M542 Cervicalgia: Secondary | ICD-10-CM | POA: Diagnosis not present

## 2023-06-12 DIAGNOSIS — M5459 Other low back pain: Secondary | ICD-10-CM | POA: Diagnosis not present

## 2023-06-12 DIAGNOSIS — M79605 Pain in left leg: Secondary | ICD-10-CM | POA: Diagnosis not present

## 2023-06-15 DIAGNOSIS — J4520 Mild intermittent asthma, uncomplicated: Secondary | ICD-10-CM | POA: Diagnosis not present

## 2023-06-15 DIAGNOSIS — J8489 Other specified interstitial pulmonary diseases: Secondary | ICD-10-CM | POA: Diagnosis not present

## 2023-06-15 DIAGNOSIS — E039 Hypothyroidism, unspecified: Secondary | ICD-10-CM | POA: Diagnosis not present

## 2023-06-15 DIAGNOSIS — G2581 Restless legs syndrome: Secondary | ICD-10-CM | POA: Diagnosis not present

## 2023-06-15 DIAGNOSIS — M810 Age-related osteoporosis without current pathological fracture: Secondary | ICD-10-CM | POA: Diagnosis not present

## 2023-06-15 DIAGNOSIS — E785 Hyperlipidemia, unspecified: Secondary | ICD-10-CM | POA: Diagnosis not present

## 2023-06-15 DIAGNOSIS — K219 Gastro-esophageal reflux disease without esophagitis: Secondary | ICD-10-CM | POA: Diagnosis not present

## 2023-06-16 ENCOUNTER — Ambulatory Visit: Payer: Medicare HMO | Admitting: Psychiatry

## 2023-06-16 ENCOUNTER — Encounter: Payer: Self-pay | Admitting: Psychiatry

## 2023-06-16 VITALS — BP 125/81 | HR 86 | Ht 66.5 in | Wt 157.4 lb

## 2023-06-16 DIAGNOSIS — G43109 Migraine with aura, not intractable, without status migrainosus: Secondary | ICD-10-CM | POA: Diagnosis not present

## 2023-06-16 DIAGNOSIS — M79605 Pain in left leg: Secondary | ICD-10-CM | POA: Diagnosis not present

## 2023-06-16 DIAGNOSIS — M5459 Other low back pain: Secondary | ICD-10-CM | POA: Diagnosis not present

## 2023-06-16 DIAGNOSIS — M542 Cervicalgia: Secondary | ICD-10-CM | POA: Diagnosis not present

## 2023-06-16 MED ORDER — UBRELVY 100 MG PO TABS: 100.0000 mg | ORAL_TABLET | ORAL | 6 refills | Status: DC | PRN

## 2023-06-16 MED ORDER — QULIPTA 30 MG PO TABS
30.0000 mg | ORAL_TABLET | Freq: Every day | ORAL | 6 refills | Status: DC
Start: 2023-06-16 — End: 2023-07-22
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
## 2023-06-16 NOTE — Patient Instructions (Signed)
GENERAL HEADACHE INSTRUCTIONS Headache Preventive Treatment: Please keep in mind that it takes 4-6 weeks for the medication to start working well and 2-3 months at the appropriate dose before deciding if it will be useful or not. If it is not helping at all by this time, then we will discuss other medications to try. Supplements may take 3-6 months until you see full effect.   Natural supplements: Magnesium Oxide or Magnesium Glycinate 500 mg at bed (up to 800 mg daily) Coenzyme Q10 300 mg in AM Vitamin B2- 200 mg twice a day  Add 1 supplement at a time since even natural supplements can have undesirable side effects. You can sometimes buy supplements cheaper (especially Coenzyme Q10) at www.WebmailGuide.co.za or at [employer].  Vitamins and herbs that show potential:  Magnesium: Magnesium (250 mg twice a day or 500 mg at bed) has a relaxant effect on smooth muscles such as blood vessels. Individuals suffering from frequent or daily headache usually have low magnesium levels which can be increase with daily supplementation of 400-750 mg. Three trials found 40-90% average headache reduction  when used as a preventative. Magnesium also demonstrated the benefit in menstrually related migraine.  Magnesium is part of the messenger system in the serotonin cascade and it is a good muscle relaxant.  It is also useful for constipation which can be a side effect of other medications used to treat migraine. Good sources include nuts, whole grains, and tomatoes. Side Effects: loose stool/diarrhea Riboflavin (vitamin B 2) 200 mg twice a day. This vitamin assists nerve cells in the production of ATP a principal energy storing molecule.  It is necessary for many chemical reactions in the body.  There have been at least 3 clinical trials of riboflavin using 400 mg per day all of which suggested that migraine frequency can be decreased.  All 3 trials showed significant improvement in over half of migraine sufferers.  The  supplement is found in bread, cereal, milk, meat, and poultry.  Most Americans get more riboflavin than the recommended daily allowance, however riboflavin deficiency is not necessary for the supplements to help prevent headache. Side effects: energizing, green urine  Coenzyme Q10: This is present in almost all cells in the body and is critical component for the conversion of energy.  Recent studies have shown that a nutritional supplement of CoQ10 can reduce the frequency of migraine attacks by improving the energy production of cells as with riboflavin.  Doses of 150 mg twice a day have been shown to be effective.  Melatonin: Increasing evidence shows correlation between melatonin secretion and headache conditions.  Melatonin supplementation has decreased headache intensity and duration.  It is widely used as a sleep aid.  Sleep is natures way of dealing with migraine.  A dose of 3 mg is recommended to start for headaches including cluster headache. Higher doses up to 15 mg has been reviewed for use in Cluster headache and have been used. The rationale behind using melatonin for cluster is that many theories regarding the cause of Cluster headache center around the disruption of the normal circadian rhythm in the brain.  This helps restore the normal circadian rhythm.  Ginger: Ginger has a small amount of antihistamine and anti-inflammatory action which may help headache.  It is primarily used for nausea and may aid in the absorption of other medications. HEADACHE DIET: Foods and beverages which may trigger migraine Note that only 20% of headache patients are food sensitive. You will know if you are  sensitive if you get a headache consistently 20 minutes to 2 hours after eating a certain food. Only cut out a food if it causes headaches, otherwise you might remove foods you enjoy! What matters most for diet is to eat a well balanced healthy diet full of vegetables and low fat protein, and to not miss  meals.  Chocolate, other sweets ALL cheeses except cottage and cream cheese Dairy products, yogurt, sour cream, ice cream Liver Meat extracts (Bovril, Marmite, meat tenderizers) Meats or fish which have undergone aging, fermenting, pickling or smoking. These include: Hotdogs,salami,Lox,sausage, mortadellas,smoked salmon, pepperoni, Pickled herring Pods of broad bean (English beans, Chinese pea pods, Italian (fava) beans, lima and navy beans Ripe avocado, ripe banana Yeast extracts or active yeast preparations such as Brewer's or Fleishman's (commercial bakes goods are permitted) Tomato based foods, pizza (lasagna, etc.)  MSG (monosodium glutamate) is disguised as many things; look for these common aliases: Monopotassium glutamate Autolysed yeast Hydrolysed protein Sodium caseinate "flavorings" "all natural preservatives" Nutrasweet  Avoid all other foods that convincingly provoke headaches.  Resources: The Dizzy Cook, Heal Your Headache Diet, migrainestrong.com  https://www.migrainestrong.com/heal-your-headache-diet-the-migraine-diet/  Caffeine and Migraine For patients that have migraine, caffeine intake more than 3 days per week can lead to dependency and increased migraine frequency. I would recommend cutting back on your caffeine intake as best you can. The recommended amount of caffeine is 200-300 mg daily, although migraine patients may experience dependency at even lower doses. While you may notice an increase in headache temporarily, cutting back will be helpful for headaches in the long run. For more information on caffeine and migraine, visit: https://americanmigrainefoundation.org/resource-library/caffeine-and-migraine/  Headache Prevention Strategies:  1. Maintain a headache diary; learn to identify and avoid triggers.  - This can be a simple note where you log when you had a headache, associated symptoms, and medications used - There are several smartphone apps  developed to help track migraines: Migraine Buddy, Migraine Monitor, Curelator N1-Headache App  Common triggers include: Emotional triggers: Emotional/Upset family or friends Emotional/Upset occupation Business reversal/success Anticipation anxiety Crisis-serious Post-crisis periodNew job/position   Physical triggers: Vacation Day Weekend Strenuous Exercise High Altitude Location New Move Menstrual Day Physical Illness Oversleep/Not enough sleep Weather changes Light: Photophobia or light sesnitivity treatment involves a balance between desensitization and reduction in overly strong input. Use dark polarized glasses outside, but not inside. Avoid bright or fluorescent light, but do not dim environment to the point that going into a normally lit room hurts. Consider FL-41 tint lenses, which reduce the most irritating wavelengths without blocking too much light.  These can be obtained at axonoptics.com or theraspecs.com Foods: see list above.  2. Limit use of acute treatments (over-the-counter medications, triptans, etc.) to no more than 2 days per week or 10 days per month to prevent medication overuse headache (rebound headache).    3. Follow a regular schedule (including weekends and holidays): Don't skip meals. Eat a balanced diet. 8 hours of sleep nightly. Minimize stress. Exercise 30 minutes per day. Being overweight is associated with a 5 times increased risk of chronic migraine. Keep well hydrated and drink 6-8 glasses of water per day.  4. Initiate non-pharmacologic measures at the earliest onset of your headache. Rest and quiet environment. Relax and reduce stress. Breathe2Relax is a free app that can instruct you on    some simple relaxtion and breathing techniques. Http://Dawnbuse.com is a    free website that provides teaching videos on relaxation.  Also, there are  many apps that     can be downloaded for "mindful" relaxation.  An app called YOGA NIDRA will help walk you  through mindfulness. Another app called Calm can be downloaded to give you a structured mindfulness guide with daily reminders and skill development. Headspace for guided meditation Mindfulness Based Stress Reduction Online Course: www.palousemindfulness.com Cold compresses.  5. Don't wait!! Take the maximum allowable dosage of prescribed medication at the first sign of migraine.  6. Compliance:  Take prescribed medication regularly as directed and at the first sign of a migraine.  7. Communicate:  Call your physician when problems arise, especially if your headaches change, increase in frequency/severity, or become associated with neurological symptoms (weakness, numbness, slurred speech, etc.).  8. Headache/pain management therapies: Consider various complementary methods, including medication, behavioral therapy, psychological counselling, biofeedback, massage therapy, acupuncture, dry needling, and other modalities.  Such measures may reduce the need for medications. Counseling for pain management, where patients learn to function and ignore/minimize their pain, seems to work very well.  9. Recommend changing family's attention and focus away from patient's headaches. Instead, emphasize daily activities. If first question of day is 'How are your headaches/Do you have a headache today?', then patient will constantly think about headaches, thus making them worse. Goal is to re-direct attention away from headaches, toward daily activities and other distractions.  10. Helpful Websites: www.AmericanHeadacheSociety.org www.migrainetrust.org www.headaches.org www.migraine.org.uk www.achenet.org  

## 2023-06-18 ENCOUNTER — Other Ambulatory Visit (HOSPITAL_COMMUNITY): Payer: Self-pay

## 2023-06-18 ENCOUNTER — Telehealth: Payer: Self-pay

## 2023-06-18 NOTE — Telephone Encounter (Signed)
Pharmacy Patient Advocate Encounter   Received notification from CoverMyMeds that prior authorization for Ubrelvy 100MG  tablets is required/requested.   Insurance verification completed.   The patient is insured through [employer] .   Per test claim: PA required; PA submitted to [facility] via CoverMyMeds Key/confirmation #/EOC [id number] Status is pending

## 2023-06-18 NOTE — Telephone Encounter (Signed)
Pharmacy Patient Advocate Encounter   Received notification from CoverMyMeds that prior authorization for Qulipta 30MG  tablets is required/requested.   Insurance verification completed.   The patient is insured through [employer] .   Per test claim: PA required; PA submitted to [facility] via CoverMyMeds Key/confirmation #/EOC [facility] Status is pending

## 2023-06-22 ENCOUNTER — Other Ambulatory Visit (HOSPITAL_COMMUNITY): Payer: Self-pay

## 2023-06-22 DIAGNOSIS — M542 Cervicalgia: Secondary | ICD-10-CM | POA: Diagnosis not present

## 2023-06-22 DIAGNOSIS — M5459 Other low back pain: Secondary | ICD-10-CM | POA: Diagnosis not present

## 2023-06-22 DIAGNOSIS — M79605 Pain in left leg: Secondary | ICD-10-CM | POA: Diagnosis not present

## 2023-06-22 NOTE — Telephone Encounter (Signed)
Pharmacy Patient Advocate Encounter  Received notification from [facility] that Prior Authorization for Qulipta 30MG  tablets has been APPROVED from 11/10/2022 to 11/10/2023.   PA #/Case ID/Reference #: PA Case ID #: [id number]

## 2023-06-22 NOTE — Telephone Encounter (Signed)
Pharmacy Patient Advocate Encounter  Received notification from [facility] that Prior Authorization for Ubrelvy 100MG  tablets has been APPROVED from 11/10/2022 to 11/10/2023   PA #/Case ID/Reference #: PA Case ID #: [id number]

## 2023-06-26 DIAGNOSIS — M5459 Other low back pain: Secondary | ICD-10-CM | POA: Diagnosis not present

## 2023-06-26 DIAGNOSIS — M542 Cervicalgia: Secondary | ICD-10-CM | POA: Diagnosis not present

## 2023-06-26 DIAGNOSIS — M79605 Pain in left leg: Secondary | ICD-10-CM | POA: Diagnosis not present

## 2023-07-01 ENCOUNTER — Emergency Department (HOSPITAL_COMMUNITY)
Admission: EM | Admit: 2023-07-01 | Discharge: 2023-07-01 | Disposition: A | Discharge status: Home / Self Care | Payer: Medicare HMO | Attending: Emergency Medicine | Admitting: Emergency Medicine

## 2023-07-01 ENCOUNTER — Emergency Department (HOSPITAL_COMMUNITY): Payer: Medicare HMO

## 2023-07-01 ENCOUNTER — Other Ambulatory Visit: Payer: Self-pay

## 2023-07-01 ENCOUNTER — Encounter (HOSPITAL_COMMUNITY): Payer: Self-pay | Admitting: Emergency Medicine

## 2023-07-01 DIAGNOSIS — I723 Aneurysm of iliac artery: Secondary | ICD-10-CM | POA: Insufficient documentation

## 2023-07-01 DIAGNOSIS — Z7982 Long term (current) use of aspirin: Secondary | ICD-10-CM | POA: Diagnosis not present

## 2023-07-01 DIAGNOSIS — M7989 Other specified soft tissue disorders: Secondary | ICD-10-CM | POA: Insufficient documentation

## 2023-07-01 DIAGNOSIS — M549 Dorsalgia, unspecified: Secondary | ICD-10-CM

## 2023-07-01 DIAGNOSIS — R0789 Other chest pain: Secondary | ICD-10-CM | POA: Insufficient documentation

## 2023-07-01 DIAGNOSIS — I7090 Unspecified atherosclerosis: Secondary | ICD-10-CM | POA: Diagnosis not present

## 2023-07-01 DIAGNOSIS — I700 Atherosclerosis of aorta: Secondary | ICD-10-CM | POA: Diagnosis not present

## 2023-07-01 DIAGNOSIS — K5900 Constipation, unspecified: Secondary | ICD-10-CM | POA: Diagnosis not present

## 2023-07-01 DIAGNOSIS — R188 Other ascites: Secondary | ICD-10-CM | POA: Diagnosis not present

## 2023-07-01 DIAGNOSIS — K5730 Diverticulosis of large intestine without perforation or abscess without bleeding: Secondary | ICD-10-CM | POA: Diagnosis not present

## 2023-07-01 DIAGNOSIS — R079 Chest pain, unspecified: Secondary | ICD-10-CM | POA: Diagnosis not present

## 2023-07-01 DIAGNOSIS — M546 Pain in thoracic spine: Secondary | ICD-10-CM | POA: Diagnosis not present

## 2023-07-01 LAB — TROPONIN I (HIGH SENSITIVITY)
Troponin I (High Sensitivity): 7 ng/L (ref <18)
Troponin I (High Sensitivity): 6 ng/L (ref <18)

## 2023-07-01 LAB — BASIC METABOLIC PANEL
Anion gap: 11 (ref 5–15)
BUN: 22 mg/dL (ref 8–23)
CO2: 25 mmol/L (ref 22–32)
Calcium: 8.8 mg/dL — ABNORMAL LOW (ref 8.9–10.3)
Chloride: 103 mmol/L (ref 98–111)
Creatinine, Ser: 0.94 mg/dL (ref 0.44–1.00)
GFR, Estimated: >60 mL/min (ref >60)
Glucose, Bld: 106 mg/dL — ABNORMAL HIGH (ref 70–99)
Potassium: 3.9 mmol/L (ref 3.5–5.1)
Sodium: 139 mmol/L (ref 135–145)

## 2023-07-01 LAB — HEPATIC FUNCTION PANEL
ALT: 17 U/L (ref 0–44)
AST: 21 U/L (ref 15–41)
Albumin: 3.7 g/dL (ref 3.5–5.0)
Alkaline Phosphatase: 43 U/L (ref 38–126)
Bilirubin, Direct: 0.1 mg/dL (ref 0.0–0.2)
Indirect Bilirubin: 1 mg/dL — ABNORMAL HIGH (ref 0.3–0.9)
Total Bilirubin: 1.1 mg/dL (ref 0.3–1.2)
Total Protein: 6 g/dL — ABNORMAL LOW (ref 6.5–8.1)

## 2023-07-01 LAB — CBC
HCT: 38.3 % (ref 36.0–46.0)
Hemoglobin: 12.1 g/dL (ref 12.0–15.0)
MCH: 29.4 pg (ref 26.0–34.0)
MCHC: 31.6 g/dL (ref 30.0–36.0)
MCV: 93 fL (ref 80.0–100.0)
Platelets: 199 10*3/uL (ref 150–400)
RBC: 4.12 MIL/uL (ref 3.87–5.11)
RDW: 13 % (ref 11.5–15.5)
WBC: 7.6 10*3/uL (ref 4.0–10.5)
nRBC: 0 % (ref 0.0–0.2)

## 2023-07-01 MED ORDER — IOHEXOL 350 MG/ML SOLN
100.0000 mL | Freq: Once | INTRAVENOUS | Status: AC | PRN
  Administered 2023-07-01: 100 mL via INTRAVENOUS

## 2023-07-01 MED ORDER — ACETAMINOPHEN 325 MG PO TABS
650.0000 mg | ORAL_TABLET | Freq: Once | ORAL | Status: AC
  Administered 2023-07-01: 650 mg via ORAL
  Filled 2023-07-01: qty 2

## 2023-07-01 NOTE — ED Notes (Signed)
ED Provider at bedside. 

## 2023-07-01 NOTE — ED Provider Notes (Signed)
[place] EMERGENCY DEPARTMENT AT [facility] Provider Note   CSN: [id number] Arrival date & time: 07/01/23  0126     History  Chief Complaint  Patient presents with   Chest Pain    [name] is a 82 y.o. female.  The history is provided by the patient, a relative and medical records.  Chest Pain [name] is a 82 y.o. female who presents to the Emergency Department complaining of back pain, chest pain.  At 11 PM she developed back pain that radiated into her chest and left arm that is described as a pressure sensation.  Pain is constant but overall improving.  This occurred while she was sleeping.  She was concerned that this could be a heart attack.  No fever, shortness of breath, diaphoresis, nausea, vomiting, abdominal pain.  She does report chronic ankle swelling, unchanged from baseline.  No prior similar symptoms.  She does have a history of Boop with partial pneumonectomy, glaucoma.  No hx/o cad, dvt/pe.     Home Medications Prior to Admission medications   Medication Sig Start Date End Date Taking? Authorizing Provider  albuterol (VENTOLIN HFA) 108 (90 Base) MCG/ACT inhaler Inhale 2 puffs into the lungs 4 (four) times daily as needed. Patient not taking: Reported on 06/16/2023    [provider]  amoxicillin-clavulanate (AUGMENTIN) 875-125 MG tablet Take 1 tablet by mouth 2 (two) times daily. Patient not taking: Reported on 06/16/2023 10/03/22   Omar Person, MD  aspirin 81 MG tablet Take 81 mg by mouth daily. Patient not taking: Reported on 06/16/2023    [provider]  Atogepant (QULIPTA) 30 MG TABS Take 1 tablet (30 mg total) by mouth daily. 06/16/23   Ocie Doyne, MD  atorvastatin (LIPITOR) 10 MG tablet Take 10 mg by mouth daily. Patient not taking: Reported on 06/16/2023    [provider]  atorvastatin (LIPITOR) 20 MG tablet     [provider]  azelastine (ASTELIN) 0.1 % nasal spray 1 puff in each  nostril Patient not taking: Reported on 06/16/2023    [provider]  azelastine (ASTEPRO) 0.1 % nasal spray Place 1 spray into both nostrils 2 (two) times daily. Patient not taking: Reported on 06/16/2023 07/24/21   Leslye Peer, MD  azithromycin (ZITHROMAX) 250 MG tablet Take 1 tablet (250 mg total) by mouth daily. Patient not taking: Reported on 06/16/2023 05/11/23   Charlott Holler, MD  benzonatate (TESSALON) 100 MG capsule Take 1 capsule (100 mg total) by mouth every 6 (six) hours as needed for cough. Patient not taking: Reported on 06/16/2023 07/17/21   Leslye Peer, MD  budesonide-formoterol [facility]) 160-4.5 MCG/ACT inhaler Inhale 2 puffs into the lungs 2 (two) times daily.    [provider]  calcium-vitamin D (OSCAL) 250-125 MG-UNIT per tablet Take 1 tablet by mouth daily.    [provider]  Cholecalciferol (VITAMIN D) 50 MCG (2000 UT) tablet 1 tablet    [provider]  Coenzyme Q10 (CO Q 10) 100 MG CAPS See admin instructions. Patient not taking: Reported on 06/16/2023    [provider]  dorzolamide (TRUSOPT) 2 % ophthalmic solution INSTILL 1 DROP INTO BOTH EYES TWICE A DAY 08/27/18   [provider]  HYDROcodone-acetaminophen (NORCO/VICODIN) 5-325 MG tablet Take 1 tablet by mouth every 4 (four) hours as needed. Patient not taking: Reported on 06/16/2023 08/16/22   Jacalyn Lefevre, MD  methocarbamol (ROBAXIN) 500 MG tablet TAKE 1 TABLET BY  MOUTH THREE TIMES A DAY AS NEEDED FOR 5 DAYS Patient not taking: Reported on 06/16/2023    [provider]  mometasone (NASONEX) 50 MCG/ACT nasal spray Place 2 sprays into the nose daily as needed. Patient not taking: Reported on 06/16/2023 07/24/21   Leslye Peer, MD  mupirocin ointment (BACTROBAN) 2 % APPLY OINTMENT EXTERNALLY TO AFFECTED AREA THREE TIMES DAILY AS NEEDED Patient not taking: Reported on 06/16/2023 11/22/18   [provider]  Omega-3 Fatty Acids (FISH OIL) 1000 MG CAPS  Take 1 capsule by mouth daily. Patient not taking: Reported on 06/16/2023    [provider]  ondansetron (ZOFRAN) 4 MG tablet Take 1 tablet (4 mg total) by mouth every 8 (eight) hours as needed for nausea or vomiting. Patient not taking: Reported on 06/16/2023 08/17/22   Tegeler, Canary Brim, MD  Ethelda Chick Calcium 500 MG TABS 1 tablet with meals Patient not taking: Reported on 06/16/2023    [provider]  pantoprazole (PROTONIX) 40 MG tablet Take 40 mg by mouth daily.    [provider]  pantoprazole (PROTONIX) 40 MG tablet 1 tablet Patient not taking: Reported on 06/16/2023    [provider]  rOPINIRole (REQUIP) 1 MG tablet Take 1 mg by mouth at bedtime. Patient not taking: Reported on 06/16/2023    [provider]  Spacer/Aero-Holding Chambers [facility] DIAMOND) MISC See admin instructions. Patient not taking: Reported on 06/16/2023    [provider]  SYNTHROID 50 MCG tablet TAKE 1 TABLET BY MOUTH ONCE EVERY MORNING ON AN EMPTY STOMACH 05/01/15   [provider]  traZODone (DESYREL) 100 MG tablet Take 100 mg by mouth at bedtime. 05/01/15   [provider]  Ubrogepant (UBRELVY) 100 MG TABS Take 1 tablet (100 mg total) by mouth as needed (for migraine). May repeat a dose in 2 hours if headache persists 06/16/23   Ocie Doyne, MD      Allergies    Dorzolamide hcl-timolol mal, Oxycodone-acetaminophen, Acetaminophen, Alphagan [brimonidine], Brimonidine tartrate, Brimonidine tartrate, Oxycodone, Oxycodone hcl, Percocet [oxycodone-acetaminophen], and Travoprost    Review of Systems   Review of Systems  Cardiovascular:  Positive for chest pain.  All other systems reviewed and are negative.   Physical Exam Updated Vital Signs BP 129/67   Pulse 64   Temp 98.2 F (36.8 C)   Resp 14   Ht 5\' 6"  (1.676 m)   Wt 71 kg   SpO2 94%   BMI 25.26 kg/m  Physical Exam Vitals and nursing note reviewed.  Constitutional:       Appearance: She is well-developed.  HENT:     Head: Normocephalic and atraumatic.  Cardiovascular:     Rate and Rhythm: Normal rate and regular rhythm.     Heart sounds: No murmur heard. Pulmonary:     Effort: Pulmonary effort is normal. No respiratory distress.     Breath sounds: Normal breath sounds.  Abdominal:     Palpations: Abdomen is soft.     Tenderness: There is no abdominal tenderness. There is no guarding or rebound.  Musculoskeletal:        General: No swelling or tenderness.  Skin:    General: Skin is warm and dry.  Neurological:     Mental Status: She is alert and oriented to person, place, and time.  Psychiatric:        Behavior: Behavior normal.     ED Results / Procedures / Treatments   Labs (all labs ordered are listed,  but only abnormal results are displayed) Labs Reviewed  BASIC METABOLIC PANEL - Abnormal; Notable for the following components:      Result Value   Glucose, Bld 106 (*)    Calcium 8.8 (*)    All other components within normal limits  HEPATIC FUNCTION PANEL - Abnormal; Notable for the following components:   Total Protein 6.0 (*)    Indirect Bilirubin 1.0 (*)    All other components within normal limits  CBC  TROPONIN I (HIGH SENSITIVITY)  TROPONIN I (HIGH SENSITIVITY)    EKG EKG Interpretation Date/Time:  Wednesday July 01 2023 01:25:46 EDT Ventricular Rate:  72 PR Interval:  138 QRS Duration:  88 QT Interval:  396 QTC Calculation: 433 R Axis:   -18  Text Interpretation: Sinus rhythm with occasional Premature ventricular complexes Otherwise normal ECG Confirmed by Tilden Fossa [id number]) on 07/01/2023 3:28:07 AM  Radiology CT Angio Chest/Abd/Pel for Dissection W and/or W/WO  Result Date: 07/01/2023 CLINICAL DATA:  Chest pain with suspected acute aortic syndrome. EXAM: CT ANGIOGRAPHY CHEST, ABDOMEN AND PELVIS TECHNIQUE: Noncontrast axial chest CT to the level of the adrenal glands was initially obtained. Multidetector CT  imaging through the chest, abdomen and pelvis was performed using the standard protocol during bolus administration of intravenous contrast. Multiplanar reconstructed images and MIPs were obtained and reviewed to evaluate the vascular anatomy. RADIATION DOSE REDUCTION: This exam was performed according to the departmental dose-optimization program which includes automated exposure control, adjustment of the mA and/or kV according to patient size and/or use of iterative reconstruction technique. CONTRAST:  OMNIPAQUE IOHEXOL 350 MG/ML SOLN COMPARISON:  AP Lat chest today, chest radiograph 05/06/2023, chest, abdomen and pelvis CT 08/16/2022, and chest CT without contrast 09/02/2021. FINDINGS: CTA CHEST FINDINGS Cardiovascular: The cardiac size is normal. There is no pericardial effusion. There are trace calcifications LAD and circumflex coronary arteries, mild calcification along the mitral ring. There is aortic atherosclerosis, scattered minimal plaque in the great vessels, with no aneurysm, stenosis or dissection. Central pulmonary veins are prominent but no more than previously. The pulmonary trunk and main arteries are chronically prominent with the pulmonary trunk 3.1 cm, findings consistent with arterial hypertension. There are no findings of acute right heart strain or interval changes. Mediastinum/Nodes: Enlarged heterogeneous thyroid with scattered dystrophic calcifications, similar appearance to the 2 prior studies. Recommendation for nonemergent thyroid ultrasound is reiterated. There are small calcified bilateral hilar lymph nodes but no noncalcified adenopathy is seen. Axillary spaces are clear. The thoracic esophagus and thoracic trachea are unremarkable. Lungs/Pleura: There is no pleural effusion, thickening or pneumothorax. Biapical scarring changes are again noted as well as a chronic 5 mm left apical nodule on 7:24, stable. Additional chronic findings on the left are noted with bronchiectasis  and underlying suprahilar chronic triangular atelectasis measuring 3.3 x 2.2 cm on 7:43, unchanged. In the superior segment of the left lower lobe, there is subsegmental atelectasis and chronic underlying mucoid impaction. There are scattered linear scar-like opacities in both bases also chronic. No new or acute abnormality of the lung fields is seen. Musculoskeletal: There is chronic thoracic kyphodextroscoliosis and multilevel degenerative discs with spondylosis. The ribcage is intact. No chest wall mass. Review of the MIP images confirms the above findings. CTA ABDOMEN AND PELVIS FINDINGS VASCULAR Aorta: There is moderate patchy calcific plaque without aneurysm, stenosis or dissection. Celiac: There are nonstenosing ostial calcific plaques without aneurysm, stenosis or dissection. Scattered calcifications in the splenic artery. No branch occlusions. SMA: Normal. Renals: Normal. There  is a single right and 2 left renal arteries. All are widely patent. The dominant left renal artery supplies the upper to midpole and a very small caliber artery arises 2.3 cm distal to this and supplies a portion of the left lower pole. IMA: Normal. Inflow: There is an aneurysm of the distal left common iliac artery measuring 1.9 x 1.5 cm (6:206). The inflow arteries are tortuous and otherwise normal in caliber. The proximal outflow arteries are clear. There are mild calcific plaques in the common iliac and internal iliac arteries but they are nonstenosing. Veins: Unopacified and not evaluated. Review of the MIP images confirms the above findings. NON-VASCULAR Hepatobiliary: No focal liver abnormality is seen. No calcified gallstones, gallbladder wall thickening, or biliary dilatation. Pancreas: No abnormality. Spleen: No abnormality. Adrenals/Urinary Tract: Adrenal glands are unremarkable. Kidneys are normal, without renal calculi, focal lesion, or hydronephrosis. There is mild bladder thickening versus underdistention. Correlate  with urinalysis for possible cystitis. Stomach/Bowel: No dilatation or wall thickening. An appendix is not seen. Moderate fecal stasis appear similar. There is diverticulosis of the descending and sigmoid colon without findings of acute diverticulitis. Lymphatic: No lymphadenopathy is evident. Reproductive: Uterus and bilateral adnexa are unremarkable. Other: Minimal posterior deep pelvic ascites. No free air, free hemorrhage or focal inflammatory process. No incarcerated hernias. Musculoskeletal: Osteopenia, levoscoliosis and degenerative changes of the spine. 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 No concerning bone lesions.  No spinal compression fractures. Review of the MIP images confirms the above findings. IMPRESSION: 1. Aortic and coronary artery atherosclerosis. No findings of acute aortic syndrome. 2. 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 No dissection. 3. Prominent pulmonary trunk and main arteries, findings consistent with arterial hypertension but no interval changes. 4. Chronic changes in the lungs. No acute chest findings are seen. 5. Constipation and diverticulosis. 6. Cystitis versus bladder nondistention. 7. Minimal pelvic ascites. 8. Osteopenia, scoliosis and degenerative change. 9. Enlarged heterogeneous thyroid with scattered dystrophic calcifications. Recommendation for nonemergent thyroid ultrasound is reiterated. Aortic Atherosclerosis (ICD10-I70.0). Electronically Signed   By: Almira Bar M.D.   On: 07/01/2023 07:03   DG Chest 2 View  Result Date: 07/01/2023 CLINICAL DATA:  Encounter for chest pain EXAM: CHEST - 2 VIEW COMPARISON:  05/06/2023 FINDINGS: Stable cardiomediastinal silhouette. Aortic atherosclerotic calcification. No focal consolidation, pleural effusion, or pneumothorax. The previous right suprahilar opacity is no longer visualized. No displaced rib fractures. Right convex thoracic curve. IMPRESSION: No acute  cardiopulmonary disease. Electronically Signed   By: Minerva Fester M.D.   On: 07/01/2023 02:15    Procedures Procedures    Medications Ordered in ED Medications  acetaminophen (TYLENOL) tablet 650 mg (650 mg Oral Given 07/01/23 0427)  iohexol (OMNIPAQUE) 350 MG/ML injection 100 mL (100 mLs Intravenous Contrast Given 07/01/23 0549)    ED Course/ Medical Decision Making/ A&P                                 Medical Decision Making Amount and/or Complexity of Data Reviewed Labs: ordered. Radiology: ordered.  Risk OTC drugs. Prescription drug management.   Patient with history of chronic lung disease here for evaluation of back pain, chest pain since 11 PM.  EKG is without acute ischemic changes and troponins are negative x 2.  Her pain is improved after acetaminophen administration.  Given radiation of her symptoms a CTA dissection protocol was obtained.  CTA is negative  for clear source for her symptoms as well as pneumonia or PE.  CT does demonstrate multiple incidental findings including iliac artery aneurysm, thyroid changes.  Discussed with patient and daughter at the bedside findings of CT scan and requirement for outpatient follow-up for further evaluation.  In terms of cystitis finding on her CT scan-she has no urinary symptoms at this time, suspect this finding is secondary to under distention of the bladder.  Feel she is stable for discharge home regarding her chest pain, likely musculoskeletal in nature versus reflux.  Discussed return precautions.        Final Clinical Impression(s) / ED Diagnoses Final diagnoses:  Upper back pain  Atypical chest pain  Iliac artery aneurysm, left(HCC)  Atherosclerosis    Rx / DC Orders ED Discharge Orders     None         Tilden Fossa, MD 07/01/23 [phone]

## 2023-07-01 NOTE — Discharge Instructions (Addendum)
The cause of your back and chest pain was not identified today.  You may take acetaminophen, over-the-counter according to label instructions as needed for pain.  You had a CT scan performed that showed multiple incidental findings.  You will need to follow-up with your family doctor to have these better looked at.  The most important findings that your doctor needs to know about our the iliac artery aneurysm as well as thyroid changes that would be better evaluated with a thyroid ultrasound.  EXAM: CT ANGIOGRAPHY CHEST, ABDOMEN AND PELVIS   TECHNIQUE: Noncontrast axial chest CT to the level of the adrenal glands was initially obtained.   Multidetector CT imaging through the chest, abdomen and pelvis was performed using the standard protocol during bolus administration of intravenous contrast. Multiplanar reconstructed images and MIPs were obtained and reviewed to evaluate the vascular anatomy.   RADIATION DOSE REDUCTION: This exam was performed according to the departmental dose-optimization program which includes automated exposure control, adjustment of the mA and/or kV according to patient size and/or use of iterative reconstruction technique.   CONTRAST:  OMNIPAQUE IOHEXOL 350 MG/ML SOLN   COMPARISON:  AP Lat chest today, chest radiograph 05/06/2023, chest, abdomen and pelvis CT 08/16/2022, and chest CT without contrast 09/02/2021.   FINDINGS: CTA CHEST FINDINGS   Cardiovascular: The cardiac size is normal. There is no pericardial effusion.   There are trace calcifications LAD and circumflex coronary arteries, mild calcification along the mitral ring.   There is aortic atherosclerosis, scattered minimal plaque in the great vessels, with no aneurysm, stenosis or dissection.   Central pulmonary veins are prominent but no more than previously. The pulmonary trunk and main arteries are chronically prominent with the pulmonary trunk 3.1 cm, findings consistent with  arterial hypertension.   There are no findings of acute right heart strain or interval changes.   Mediastinum/Nodes: Enlarged heterogeneous thyroid with scattered dystrophic calcifications, similar appearance to the 2 prior studies. Recommendation for nonemergent thyroid ultrasound is reiterated.   There are small calcified bilateral hilar lymph nodes but no noncalcified adenopathy is seen.   Axillary spaces are clear. The thoracic esophagus and thoracic trachea are unremarkable.   Lungs/Pleura: There is no pleural effusion, thickening or pneumothorax. Biapical scarring changes are again noted as well as a chronic 5 mm left apical nodule on 7:24, stable.   Additional chronic findings on the left are noted with bronchiectasis and underlying suprahilar chronic triangular atelectasis measuring 3.3 x 2.2 cm on 7:43, unchanged.   In the superior segment of the left lower lobe, there is subsegmental atelectasis and chronic underlying mucoid impaction.   There are scattered linear scar-like opacities in both bases also chronic. No new or acute abnormality of the lung fields is seen.   Musculoskeletal: There is chronic thoracic kyphodextroscoliosis and multilevel degenerative discs with spondylosis. The ribcage is intact. No chest wall mass.   Review of the MIP images confirms the above findings.   CTA ABDOMEN AND PELVIS FINDINGS   VASCULAR   Aorta: There is moderate patchy calcific plaque without aneurysm, stenosis or dissection.   Celiac: There are nonstenosing ostial calcific plaques without aneurysm, stenosis or dissection. Scattered calcifications in the splenic artery. No branch occlusions.   SMA: Normal.   Renals: Normal. There is a single right and 2 left renal arteries. All are widely patent. The dominant left renal artery supplies the upper to midpole and a very small caliber artery arises 2.3 cm distal to this and supplies a portion  of the left lower pole.    IMA: Normal.   Inflow: There is an aneurysm of the distal left common iliac artery measuring 1.9 x 1.5 cm (6:206). The inflow arteries are tortuous and otherwise normal in caliber. The proximal outflow arteries are clear.   There are mild calcific plaques in the common iliac and internal iliac arteries but they are nonstenosing.   Veins: Unopacified and not evaluated.   Review of the MIP images confirms the above findings.   NON-VASCULAR   Hepatobiliary: No focal liver abnormality is seen. No calcified gallstones, gallbladder wall thickening, or biliary dilatation.   Pancreas: No abnormality.   Spleen: No abnormality.   Adrenals/Urinary Tract: Adrenal glands are unremarkable. Kidneys are normal, without renal calculi, focal lesion, or hydronephrosis. There is mild bladder thickening versus underdistention. Correlate with urinalysis for possible cystitis.   Stomach/Bowel: No dilatation or wall thickening. An appendix is not seen. Moderate fecal stasis appear similar. There is diverticulosis of the descending and sigmoid colon without findings of acute diverticulitis.   Lymphatic: No lymphadenopathy is evident.   Reproductive: Uterus and bilateral adnexa are unremarkable.   Other: Minimal posterior deep pelvic ascites. No free air, free hemorrhage or focal inflammatory process. No incarcerated hernias.   Musculoskeletal: Osteopenia, levoscoliosis and degenerative changes of the spine. 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   No concerning bone lesions.  No spinal compression fractures.   Review of the MIP images confirms the above findings.   IMPRESSION: 1. Aortic and coronary artery atherosclerosis. No findings of acute aortic syndrome. 2. 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 No dissection. 3. Prominent pulmonary trunk and main arteries, findings consistent with arterial hypertension but no interval changes. 4.  Chronic changes in the lungs. No acute chest findings are seen. 5. Constipation and diverticulosis. 6. Cystitis versus bladder nondistention. 7. Minimal pelvic ascites. 8. Osteopenia, scoliosis and degenerative change. 9. Enlarged heterogeneous thyroid with scattered dystrophic calcifications. Recommendation for nonemergent thyroid ultrasound is reiterated.   Aortic Atherosclerosis (ICD10-I70.0).

## 2023-07-01 NOTE — ED Triage Notes (Addendum)
Patient went to bed at 11 pm tonight with chest pressure to the left side of the chest radiating to the left arm along with shortness of breath. Denies n/v/d. Called EMS to evaluate her at her home however they stated per EKG they couldn't see an MI but recommended further evaluation so patients daughter drove her here. Patient had a recent addition to medication regimen earlier this month received Rx for Bernita Raisin and took it for the first time tonight.

## 2023-07-03 DIAGNOSIS — M542 Cervicalgia: Secondary | ICD-10-CM | POA: Diagnosis not present

## 2023-07-03 DIAGNOSIS — M79605 Pain in left leg: Secondary | ICD-10-CM | POA: Diagnosis not present

## 2023-07-03 DIAGNOSIS — M5459 Other low back pain: Secondary | ICD-10-CM | POA: Diagnosis not present

## 2023-07-14 DIAGNOSIS — I723 Aneurysm of iliac artery: Secondary | ICD-10-CM | POA: Diagnosis not present

## 2023-07-17 ENCOUNTER — Other Ambulatory Visit: Payer: Self-pay | Admitting: Family Medicine

## 2023-07-17 DIAGNOSIS — I723 Aneurysm of iliac artery: Secondary | ICD-10-CM

## 2023-07-22 ENCOUNTER — Telehealth: Payer: Self-pay | Admitting: Psychiatry

## 2023-07-22 MED ORDER — QULIPTA 10 MG PO TABS: 10.0000 mg | ORAL_TABLET | Freq: Every day | ORAL | 6 refills | Status: DC

## 2023-07-22 NOTE — Telephone Encounter (Signed)
Would she be willing to try a lower dose of the Qulipta? It's possible the 30 mg was too high for her, but I can send in a 10 mg tablet instead

## 2023-07-22 NOTE — Telephone Encounter (Signed)
Spoke with pt and let her know per Dr. Delena Bali "Rx for the 10 mg tablets sent to her pharmacy" Pt verbalized understanding.

## 2023-07-22 NOTE — Telephone Encounter (Signed)
Patient said she is willing to try the Qulipta 10 mg tablets she will stop if she noticed any of the previous symptoms and let us know. Please send Rx to [facility] [address]

## 2023-07-22 NOTE — Telephone Encounter (Signed)
Spoke with patient about the below, pt said she stopped Qulipta this past weekend due to feeling dizzy, SOB and constipation. Pt said since stopping the medication she has daily headaches, she did not have daily headaches while taking Qulipta. She still taking Ubrelvy as needed. She asked if another medication can be sent to [facility].   Please advise

## 2023-07-22 NOTE — Telephone Encounter (Signed)
Rx for the 10 mg tablets sent to her pharmacy, thanks

## 2023-07-22 NOTE — Telephone Encounter (Signed)
Pt states  Atogepant (QULIPTA) 30 MG TABS made her sick so she no longer takes it, but pt states now she has daily headaches, she'd like a call to discuss

## 2023-07-22 NOTE — Addendum Note (Signed)
Addended by: Ocie Doyne on: 07/22/2023 12:53 PM   Modules accepted: Orders

## 2023-07-24 ENCOUNTER — Ambulatory Visit
Admission: RE | Admit: 2023-07-24 | Discharge: 2023-07-24 | Disposition: A | Discharge status: Home / Self Care | Payer: Medicare HMO | Source: Ambulatory Visit | Attending: Family Medicine | Admitting: Family Medicine

## 2023-07-24 DIAGNOSIS — I723 Aneurysm of iliac artery: Secondary | ICD-10-CM | POA: Diagnosis not present

## 2023-07-24 HISTORY — PX: IR RADIOLOGIST EVAL & MGMT: IMG5224
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
## 2023-07-29 DIAGNOSIS — M5459 Other low back pain: Secondary | ICD-10-CM | POA: Diagnosis not present

## 2023-07-29 DIAGNOSIS — M542 Cervicalgia: Secondary | ICD-10-CM | POA: Diagnosis not present

## 2023-07-29 DIAGNOSIS — M79605 Pain in left leg: Secondary | ICD-10-CM | POA: Diagnosis not present

## 2023-08-03 DIAGNOSIS — M542 Cervicalgia: Secondary | ICD-10-CM | POA: Diagnosis not present

## 2023-08-03 DIAGNOSIS — M79605 Pain in left leg: Secondary | ICD-10-CM | POA: Diagnosis not present

## 2023-08-03 DIAGNOSIS — M5459 Other low back pain: Secondary | ICD-10-CM | POA: Diagnosis not present

## 2023-08-06 ENCOUNTER — Encounter: Payer: Self-pay | Admitting: Emergency Medicine

## 2023-08-06 ENCOUNTER — Ambulatory Visit: Payer: Medicare HMO | Admitting: Emergency Medicine

## 2023-08-06 VITALS — BP 124/72 | HR 72 | Ht 66.0 in | Wt 160.6 lb

## 2023-08-06 DIAGNOSIS — M79605 Pain in left leg: Secondary | ICD-10-CM | POA: Diagnosis not present

## 2023-08-06 DIAGNOSIS — M542 Cervicalgia: Secondary | ICD-10-CM | POA: Diagnosis not present

## 2023-08-06 DIAGNOSIS — K219 Gastro-esophageal reflux disease without esophagitis: Secondary | ICD-10-CM | POA: Diagnosis not present

## 2023-08-06 DIAGNOSIS — J441 Chronic obstructive pulmonary disease with (acute) exacerbation: Secondary | ICD-10-CM

## 2023-08-06 DIAGNOSIS — Z23 Encounter for immunization: Secondary | ICD-10-CM | POA: Diagnosis not present

## 2023-08-06 DIAGNOSIS — M5459 Other low back pain: Secondary | ICD-10-CM | POA: Diagnosis not present

## 2023-08-06 NOTE — Assessment & Plan Note (Signed)
Improved from her recent flare in June.  Imaging reassuring including a CT-PA done 07/01/2023 when she was being seen for chest discomfort.  Question whether there may have been a GERD component  Please continue your Symbicort 2 puffs twice a day. Rinse and gargle after using  Keep albuterol available to use 2 puffs up to every 4 hours if needed for shortness of breath, chest tightness, wheezing.  Flu shot today.  Continue your pantoprazole 40mg  daily as needed for reflux. Follow with APP in 6 months Follow Dr. Delton Coombes in 12 months, sooner if any problems.

## 2023-08-06 NOTE — Assessment & Plan Note (Signed)
Continue pantoprazole as ordered.  She has only had a single episode of chest pain as described

## 2023-08-06 NOTE — Patient Instructions (Addendum)
Please continue your Symbicort 2 puffs twice a day. Rinse and gargle after using  Keep albuterol available to use 2 puffs up to every 4 hours if needed for shortness of breath, chest tightness, wheezing.  Flu shot today.  Continue your pantoprazole 40mg  daily as needed for reflux. Follow with APP in 6 months Follow Dr. Delton Coombes in 12 months, sooner if any problems.
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
## 2023-08-10 DIAGNOSIS — H401132 Primary open-angle glaucoma, bilateral, moderate stage: Secondary | ICD-10-CM | POA: Diagnosis not present

## 2023-08-10 DIAGNOSIS — M542 Cervicalgia: Secondary | ICD-10-CM | POA: Diagnosis not present

## 2023-08-10 DIAGNOSIS — Z83511 Family history of glaucoma: Secondary | ICD-10-CM | POA: Diagnosis not present

## 2023-08-10 DIAGNOSIS — M79605 Pain in left leg: Secondary | ICD-10-CM | POA: Diagnosis not present

## 2023-08-10 DIAGNOSIS — M5459 Other low back pain: Secondary | ICD-10-CM | POA: Diagnosis not present

## 2023-08-20 DIAGNOSIS — M79605 Pain in left leg: Secondary | ICD-10-CM | POA: Diagnosis not present

## 2023-08-20 DIAGNOSIS — M5459 Other low back pain: Secondary | ICD-10-CM | POA: Diagnosis not present

## 2023-08-20 DIAGNOSIS — M542 Cervicalgia: Secondary | ICD-10-CM | POA: Diagnosis not present

## 2023-08-26 DIAGNOSIS — M79605 Pain in left leg: Secondary | ICD-10-CM | POA: Diagnosis not present

## 2023-08-26 DIAGNOSIS — M5459 Other low back pain: Secondary | ICD-10-CM | POA: Diagnosis not present

## 2023-08-26 DIAGNOSIS — M542 Cervicalgia: Secondary | ICD-10-CM | POA: Diagnosis not present

## 2023-09-01 DIAGNOSIS — M542 Cervicalgia: Secondary | ICD-10-CM | POA: Diagnosis not present

## 2023-09-01 DIAGNOSIS — M79605 Pain in left leg: Secondary | ICD-10-CM | POA: Diagnosis not present

## 2023-09-01 DIAGNOSIS — M5459 Other low back pain: Secondary | ICD-10-CM | POA: Diagnosis not present

## 2023-09-04 DIAGNOSIS — M79605 Pain in left leg: Secondary | ICD-10-CM | POA: Diagnosis not present

## 2023-09-04 DIAGNOSIS — M542 Cervicalgia: Secondary | ICD-10-CM | POA: Diagnosis not present

## 2023-09-04 DIAGNOSIS — M5459 Other low back pain: Secondary | ICD-10-CM | POA: Diagnosis not present

## 2023-09-07 DIAGNOSIS — M542 Cervicalgia: Secondary | ICD-10-CM | POA: Diagnosis not present

## 2023-09-07 DIAGNOSIS — M79605 Pain in left leg: Secondary | ICD-10-CM | POA: Diagnosis not present

## 2023-09-07 DIAGNOSIS — M5459 Other low back pain: Secondary | ICD-10-CM | POA: Diagnosis not present

## 2023-09-09 ENCOUNTER — Telehealth: Payer: Self-pay | Admitting: Psychiatry

## 2023-09-09 NOTE — Telephone Encounter (Signed)
Pt would like a call from the nurse to discuss cost of Atogepant (QULIPTA) 10 MG TABS and Ubrogepant (UBRELVY) 100 MG TABS  . Went to the pharmacy cost was $600, I can not afford that. The pharmacy instructed to mention to the physician that I have aneurysm. There are some things can not take if have an aneurysm.

## 2023-09-10 ENCOUNTER — Encounter: Payer: Self-pay | Admitting: Podiatry

## 2023-09-10 ENCOUNTER — Ambulatory Visit: Payer: Medicare HMO | Admitting: Podiatry

## 2023-09-10 DIAGNOSIS — M79676 Pain in unspecified toe(s): Secondary | ICD-10-CM | POA: Diagnosis not present

## 2023-09-10 DIAGNOSIS — B351 Tinea unguium: Secondary | ICD-10-CM

## 2023-09-10 NOTE — Progress Notes (Signed)
This patient returns to the office for evaluation and treatment of long thick painful nails .  This patient is unable to trim her own nails since the patient cannot reach her feet.  Patient says the nails are painful walking and wearing his shoes.  She returns for preventive foot care services.  General Appearance  Alert, conversant and in no acute stress.  Vascular  Dorsalis pedis and posterior tibial  pulses are  weakly palpable  bilaterally.  Capillary return is within normal limits  bilaterally. Temperature is within normal limits  bilaterally.  Neurologic  Senn-Weinstein monofilament wire test within normal limits  bilaterally. Muscle power within normal limits bilaterally.  Nails Thick disfigured discolored nails with subungual debris  from hallux to third  toes bilaterally. No evidence of bacterial infection or drainage bilaterally.  Orthopedic  No limitations of motion  feet .  No crepitus or effusions noted.  No bony pathology or digital deformities noted.  Skin  normotropic skin with no porokeratosis noted bilaterally.  No signs of infections or ulcers noted.     Onychomycosis  Pain in toes right foot  Pain in toes left foot  Debridement  of nails  1-5  B/L with a nail nipper.  Nails were then filed using a dremel tool with no incidents.    RTC  10 weeks    Helane Gunther DPM

## 2023-09-11 DIAGNOSIS — M5459 Other low back pain: Secondary | ICD-10-CM | POA: Diagnosis not present

## 2023-09-11 DIAGNOSIS — M542 Cervicalgia: Secondary | ICD-10-CM | POA: Diagnosis not present

## 2023-09-11 DIAGNOSIS — M79605 Pain in left leg: Secondary | ICD-10-CM | POA: Diagnosis not present

## 2023-09-28 ENCOUNTER — Telehealth: Payer: Self-pay | Admitting: Emergency Medicine

## 2023-09-28 NOTE — Telephone Encounter (Signed)
Patient was reviewing her insurance for next year and they advised her to use the generic Symbicort; budisonide farlaerol and it would be cheaper. She wanted to ask Dr.Byrum first if it was okay for her to use the generic brand.

## 2023-10-05 NOTE — Telephone Encounter (Signed)
Called and spoke with patient, she stated that she started to change insurance companies, but then decided not to change.  I advised her that we use both the name brand Symbicort and the generic form and patients do fine on both.  Advised her to let us know if she needed hers changed to generic.  She verbalized understanding.  Nothing further needed.

## 2023-10-14 ENCOUNTER — Ambulatory Visit: Payer: Medicare HMO | Admitting: Adult Health

## 2023-10-14 ENCOUNTER — Encounter: Payer: Self-pay | Admitting: Adult Health

## 2023-10-14 VITALS — BP 126/68 | HR 85 | Ht 66.0 in | Wt 160.0 lb

## 2023-10-14 DIAGNOSIS — G44321 Chronic post-traumatic headache, intractable: Secondary | ICD-10-CM

## 2023-10-14 DIAGNOSIS — G43109 Migraine with aura, not intractable, without status migrainosus: Secondary | ICD-10-CM

## 2023-10-14 MED ORDER — TOPIRAMATE 25 MG PO TABS: 25.0000 mg | ORAL_TABLET | Freq: Every day | ORAL | 5 refills | Status: DC

## 2023-10-14 NOTE — Patient Instructions (Addendum)
Your Plan:  Start topamax 25mg  nightly for headache prevention  Please call after 1-2 weeks if no benefit or sooner if difficulty tolerating   Continue use of Tylenol as needed but please limit this to no more than 2 times per week as more frequent use can cause rebound headaches      Follow up in 6 months or call earlier if needed     Thank you for coming to see [place] at [facility]. I hope we have been able to provide you high quality care today.  You may receive a patient satisfaction survey over the next few weeks. We would appreciate your feedback and comments so that we may continue to improve ourselves and the health of our patients.    Analgesic Rebound Headache An analgesic rebound headache is a secondary disorder that is caused by the overuse of pain medicine (analgesic) to treat the original (primary) headache. It is sometimes called a medication overuse headache or a drug-induced headache. Any type of primary headache can return as a rebound headache if a person regularly takes pain medicine. The types of primary headaches that are commonly related to rebound headaches include: Migraines. Tension headaches. These are caused by tense muscles in the head and neck area. Cluster headaches. These happen on one side of the head and around the eye. If rebound headaches continue, they can become long-term, daily headaches. What are the causes? Rebound headaches may be caused by frequent use of: Over-the-counter medicines, such as aspirin, ibuprofen, and acetaminophen. Sinus-relief medicines. Medicines that contain caffeine. Narcotic pain medicines, such as codeine and oxycodone. Some prescription migraine medicines. What are the signs or symptoms? The symptoms of a rebound headache are the same as the symptoms of the primary headache. Symptoms of specific types of headaches include: Migraine headache Pulsing or throbbing pain on one or both sides of the  head. Severe pain that makes it hard to do daily activities. Pain that gets worse with physical activity. Nausea, vomiting, or both. Pain that may get worse around bright lights, loud noises, or smells. Vision changes. Numbness in one or both arms. Tension headache Pressure around the head. Dull, aching head pain. Pain felt over the front and sides of the head. Tenderness in the muscles of the head, neck, and shoulders. Cluster headache Severe pain that begins in or around one eye or temple. Droopy or swollen eyelid, or redness and tearing in the eye on the same side as the pain. One-sided head pain. Nausea. Runny nose. Sweaty, pale facial skin. Restlessness. How is this diagnosed? Rebound headaches are diagnosed by reviewing: Your medical history, including a description of your primary headaches. Your pain medicines for your primary headaches and how often you take them. How is this treated? Rebound headaches may be treated or managed by: Stopping frequent use of pain medicine. This may make your headaches worse at first, but the pain should then become less manageable and less frequent and severe. Seeing a headache specialist. They may be able to help you manage your headaches and help make sure there is not another cause of the headaches. Using methods of stress relief, such as acupuncture, counseling, biofeedback, and massage. Follow these instructions at home: Medicines  Take over-the-counter and prescription medicines only as told by your health care provider. Stop the repeated use of pain medicine as told by your provider. Stopping can be hard. Carefully follow instructions from your provider. Lifestyle Do not drink alcohol. Do not use any products that  contain nicotine or tobacco. These products include cigarettes, chewing tobacco, and vaping devices, such as e-cigarettes. If you need help quitting, ask your provider. Get 7-9 hours of sleep each night, or the amount  recommended by your provider. Find ways to manage stress, such as acupuncture, counseling, biofeedback, and massage. Exercise regularly. Exercise for at least 30 minutes, 5 times each week. General instructions Avoid things that may bring on (trigger) your primary headaches, such as certain foods. Contact a health care provider if: Medicine does not help your migraine. Your pain keeps coming back even with medicine. Get help right away if: You have new headache pain. You have headache pain that is different than what you have felt in the past. You have numbness or tingling in your arms or legs. You have changes in your speech or vision. This information is not intended to replace advice given to you by your health care provider. Make sure you discuss any questions you have with your health care provider. Document Revised: 06/23/2022 Document Reviewed: 06/23/2022 Elsevier Patient Education  2024 [employer].
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
## 2023-11-19 ENCOUNTER — Other Ambulatory Visit (HOSPITAL_COMMUNITY): Payer: Self-pay

## 2023-11-23 ENCOUNTER — Ambulatory Visit: Payer: Medicare HMO | Admitting: Podiatry

## 2023-11-24 DIAGNOSIS — L309 Dermatitis, unspecified: Secondary | ICD-10-CM | POA: Diagnosis not present

## 2023-12-01 ENCOUNTER — Encounter: Payer: Self-pay | Admitting: Podiatry

## 2023-12-01 ENCOUNTER — Ambulatory Visit: Payer: Medicare HMO | Admitting: Podiatry

## 2023-12-01 DIAGNOSIS — B351 Tinea unguium: Secondary | ICD-10-CM | POA: Diagnosis not present

## 2023-12-01 DIAGNOSIS — M79609 Pain in unspecified limb: Secondary | ICD-10-CM

## 2023-12-01 NOTE — Progress Notes (Signed)
This patient returns to the office for evaluation and treatment of long thick painful nails .  This patient is unable to trim her own nails since the patient cannot reach her feet.  Patient says the nails are painful walking and wearing his shoes.  She returns for preventive foot care services.  General Appearance  Alert, conversant and in no acute stress.  Vascular  Dorsalis pedis and posterior tibial  pulses are  weakly palpable  bilaterally.  Capillary return is within normal limits  bilaterally. Temperature is within normal limits  bilaterally.  Neurologic  Senn-Weinstein monofilament wire test within normal limits  bilaterally. Muscle power within normal limits bilaterally.  Nails Thick disfigured discolored nails with subungual debris  from hallux to third  toes bilaterally. No evidence of bacterial infection or drainage bilaterally.  Orthopedic  No limitations of motion  feet .  No crepitus or effusions noted.  No bony pathology or digital deformities noted.  Skin  normotropic skin with no porokeratosis noted bilaterally.  No signs of infections or ulcers noted.     Onychomycosis  Pain in toes right foot  Pain in toes left foot  Debridement  of nails  1-5  B/L with a nail nipper.  Nails were then filed using a dremel tool with no incidents.    RTC  10 weeks    Helane Gunther DPM

## 2023-12-09 ENCOUNTER — Telehealth: Payer: Self-pay | Admitting: Adult Health

## 2023-12-09 NOTE — Telephone Encounter (Signed)
Patient was previously on Qulipta 10 mg daily with improvement of headaches but was unable to continue after October due to high co-pay.  If patient is interested, we can try to restart this and see if the co-pay is better.  If patient is not interested in this route, we could try amitriptyline 10 mg nightly to further help with headaches.  Please let me know what patient is interested in doing.  Thank you.

## 2023-12-09 NOTE — Telephone Encounter (Signed)
Called the patient and she states when she started to take the medication she noted after a few days she developed the numbness/tingling sensation. She did have improvement with the headaches while on it but the side effects were bothersome. She also noted to have memory concerns/ brain fog affected. She stopped the medication a week ago and has developed headache daily again. Advised I would inform Shanda Bumps and see what she would recommend

## 2023-12-09 NOTE — Telephone Encounter (Signed)
Pt states she is having tingling in her ams and legs and believes it is from the  topiramate (TOPAMAX) 25 MG tablet she is asking for a call to discuss.

## 2023-12-10 ENCOUNTER — Telehealth: Payer: Self-pay | Admitting: Adult Health

## 2023-12-10 MED ORDER — QULIPTA 10 MG PO TABS: 10.0000 mg | ORAL_TABLET | Freq: Every day | ORAL | 5 refills | Status: DC

## 2023-12-10 NOTE — Telephone Encounter (Signed)
Pt returned call I reviewed the recommendation and she is amendable to trying to see if the copay is better now for Qulipta. If not, she will try the next recommendation. Advised I will send the script to the pharmacy

## 2023-12-10 NOTE — Telephone Encounter (Signed)
Called the patient back. There was no answer. LVM asking the patient to call back to discuss recommendation.

## 2023-12-10 NOTE — Telephone Encounter (Signed)
Call returned to RN

## 2023-12-10 NOTE — Addendum Note (Signed)
Addended by: Judi Cong on: 12/10/2023 03:52 PM   Modules accepted: Orders

## 2024-01-07 DIAGNOSIS — G2581 Restless legs syndrome: Secondary | ICD-10-CM | POA: Diagnosis not present

## 2024-01-07 DIAGNOSIS — Z1331 Encounter for screening for depression: Secondary | ICD-10-CM | POA: Diagnosis not present

## 2024-01-07 DIAGNOSIS — J8489 Other specified interstitial pulmonary diseases: Secondary | ICD-10-CM | POA: Diagnosis not present

## 2024-01-07 DIAGNOSIS — L989 Disorder of the skin and subcutaneous tissue, unspecified: Secondary | ICD-10-CM | POA: Diagnosis not present

## 2024-01-07 DIAGNOSIS — J4520 Mild intermittent asthma, uncomplicated: Secondary | ICD-10-CM | POA: Diagnosis not present

## 2024-01-07 DIAGNOSIS — E7800 Pure hypercholesterolemia, unspecified: Secondary | ICD-10-CM | POA: Diagnosis not present

## 2024-01-07 DIAGNOSIS — E039 Hypothyroidism, unspecified: Secondary | ICD-10-CM | POA: Diagnosis not present

## 2024-01-07 DIAGNOSIS — Z0000 Encounter for general adult medical examination without abnormal findings: Secondary | ICD-10-CM | POA: Diagnosis not present

## 2024-01-07 DIAGNOSIS — G4700 Insomnia, unspecified: Secondary | ICD-10-CM | POA: Diagnosis not present

## 2024-01-13 DIAGNOSIS — M79641 Pain in right hand: Secondary | ICD-10-CM | POA: Diagnosis not present

## 2024-01-13 DIAGNOSIS — R2231 Localized swelling, mass and lump, right upper limb: Secondary | ICD-10-CM | POA: Diagnosis not present

## 2024-01-13 DIAGNOSIS — R2230 Localized swelling, mass and lump, unspecified upper limb: Secondary | ICD-10-CM | POA: Insufficient documentation

## 2024-01-25 DIAGNOSIS — J45909 Unspecified asthma, uncomplicated: Secondary | ICD-10-CM | POA: Diagnosis not present

## 2024-01-25 DIAGNOSIS — Z83511 Family history of glaucoma: Secondary | ICD-10-CM | POA: Diagnosis not present

## 2024-01-25 DIAGNOSIS — H401132 Primary open-angle glaucoma, bilateral, moderate stage: Secondary | ICD-10-CM | POA: Diagnosis not present

## 2024-01-28 DIAGNOSIS — E041 Nontoxic single thyroid nodule: Secondary | ICD-10-CM | POA: Diagnosis not present

## 2024-01-28 DIAGNOSIS — E042 Nontoxic multinodular goiter: Secondary | ICD-10-CM | POA: Diagnosis not present

## 2024-02-07 ENCOUNTER — Telehealth: Payer: Self-pay | Admitting: Pulmonary Disease

## 2024-02-07 MED ORDER — PREDNISONE 20 MG PO TABS: ORAL_TABLET | ORAL | 0 refills | Status: AC

## 2024-02-07 NOTE — Telephone Encounter (Signed)
 Thank you :)

## 2024-02-07 NOTE — Telephone Encounter (Signed)
 Received call from after-hours answering service from clinic.  Connected with patient via phone.  History of COPD/asthma/organizing pneumonia.  Increased cough and congestion for the last week or so.  Nicole Cella is likely related to the pollens.  Possible viral infection.  Given Symbicort as prescribed and uses albuterol occasionally.  Albuterol helps some.  Prednisone usually reliably improved symptoms.  Prednisone 40 mg for 5 days then 20 mg for 5 days sent to local pharmacy.

## 2024-02-10 ENCOUNTER — Ambulatory Visit: Payer: Medicare HMO | Admitting: Podiatry

## 2024-02-11 ENCOUNTER — Other Ambulatory Visit: Payer: Self-pay | Admitting: Family Medicine

## 2024-02-11 DIAGNOSIS — E042 Nontoxic multinodular goiter: Secondary | ICD-10-CM

## 2024-02-17 ENCOUNTER — Encounter: Payer: Self-pay | Admitting: Family Medicine

## 2024-02-19 ENCOUNTER — Ambulatory Visit: Admitting: Podiatry

## 2024-02-19 ENCOUNTER — Encounter: Payer: Self-pay | Admitting: Podiatry

## 2024-02-19 DIAGNOSIS — B351 Tinea unguium: Secondary | ICD-10-CM

## 2024-02-19 DIAGNOSIS — M79609 Pain in unspecified limb: Secondary | ICD-10-CM

## 2024-02-19 NOTE — Progress Notes (Signed)
This patient returns to the office for evaluation and treatment of long thick painful nails .  This patient is unable to trim her own nails since the patient cannot reach her feet.  Patient says the nails are painful walking and wearing his shoes.  She returns for preventive foot care services.  General Appearance  Alert, conversant and in no acute stress.  Vascular  Dorsalis pedis and posterior tibial  pulses are  weakly palpable  bilaterally.  Capillary return is within normal limits  bilaterally. Temperature is within normal limits  bilaterally.  Neurologic  Senn-Weinstein monofilament wire test within normal limits  bilaterally. Muscle power within normal limits bilaterally.  Nails Thick disfigured discolored nails with subungual debris  from hallux to third  toes bilaterally. No evidence of bacterial infection or drainage bilaterally.  Orthopedic  No limitations of motion  feet .  No crepitus or effusions noted.  No bony pathology or digital deformities noted.  Skin  normotropic skin with no porokeratosis noted bilaterally.  No signs of infections or ulcers noted.     Onychomycosis  Pain in toes right foot  Pain in toes left foot  Debridement  of nails  1-5  B/L with a nail nipper.  Nails were then filed using a dremel tool with no incidents.    RTC  10 weeks    Helane Gunther DPM

## 2024-02-22 ENCOUNTER — Ambulatory Visit
Admission: RE | Admit: 2024-02-22 | Discharge: 2024-02-22 | Disposition: A | Discharge status: Home / Self Care | Source: Ambulatory Visit | Attending: Family Medicine | Admitting: Family Medicine

## 2024-02-22 ENCOUNTER — Other Ambulatory Visit (HOSPITAL_COMMUNITY)
Admission: RE | Admit: 2024-02-22 | Discharge: 2024-02-22 | Disposition: A | Discharge status: Home / Self Care | Source: Ambulatory Visit

## 2024-02-22 DIAGNOSIS — E042 Nontoxic multinodular goiter: Secondary | ICD-10-CM | POA: Insufficient documentation

## 2024-02-22 DIAGNOSIS — E041 Nontoxic single thyroid nodule: Secondary | ICD-10-CM | POA: Diagnosis not present

## 2024-02-23 LAB — CYTOLOGY - NON PAP

## 2024-03-21 ENCOUNTER — Other Ambulatory Visit: Payer: Self-pay | Admitting: Family Medicine

## 2024-03-21 DIAGNOSIS — Z1231 Encounter for screening mammogram for malignant neoplasm of breast: Secondary | ICD-10-CM

## 2024-04-05 ENCOUNTER — Ambulatory Visit
Admission: RE | Admit: 2024-04-05 | Discharge: 2024-04-05 | Disposition: A | Discharge status: Home / Self Care | Source: Ambulatory Visit | Attending: Family Medicine | Admitting: Family Medicine

## 2024-04-05 DIAGNOSIS — Z1231 Encounter for screening mammogram for malignant neoplasm of breast: Secondary | ICD-10-CM

## 2024-04-06 ENCOUNTER — Ambulatory Visit

## 2024-04-22 ENCOUNTER — Telehealth: Payer: Self-pay | Admitting: Adult Health

## 2024-04-22 NOTE — Telephone Encounter (Signed)
 Pt called  to reschedule Appt  Appt Scheduled

## 2024-04-25 ENCOUNTER — Other Ambulatory Visit: Payer: Self-pay

## 2024-04-25 ENCOUNTER — Emergency Department (HOSPITAL_BASED_OUTPATIENT_CLINIC_OR_DEPARTMENT_OTHER)

## 2024-04-25 ENCOUNTER — Inpatient Hospital Stay (HOSPITAL_BASED_OUTPATIENT_CLINIC_OR_DEPARTMENT_OTHER)
Admission: EM | Admit: 2024-04-25 | Discharge: 2024-04-27 | DRG: 193 | Disposition: A | Discharge status: Home / Self Care | Attending: Internal Medicine | Admitting: Internal Medicine

## 2024-04-25 ENCOUNTER — Ambulatory Visit: Payer: Self-pay

## 2024-04-25 ENCOUNTER — Encounter (HOSPITAL_BASED_OUTPATIENT_CLINIC_OR_DEPARTMENT_OTHER): Payer: Self-pay

## 2024-04-25 DIAGNOSIS — K219 Gastro-esophageal reflux disease without esophagitis: Secondary | ICD-10-CM | POA: Diagnosis present

## 2024-04-25 DIAGNOSIS — Z7951 Long term (current) use of inhaled steroids: Secondary | ICD-10-CM

## 2024-04-25 DIAGNOSIS — Z79899 Other long term (current) drug therapy: Secondary | ICD-10-CM | POA: Diagnosis not present

## 2024-04-25 DIAGNOSIS — J449 Chronic obstructive pulmonary disease, unspecified: Secondary | ICD-10-CM | POA: Diagnosis present

## 2024-04-25 DIAGNOSIS — E039 Hypothyroidism, unspecified: Secondary | ICD-10-CM | POA: Diagnosis present

## 2024-04-25 DIAGNOSIS — R918 Other nonspecific abnormal finding of lung field: Secondary | ICD-10-CM

## 2024-04-25 DIAGNOSIS — Z902 Acquired absence of lung [part of]: Secondary | ICD-10-CM | POA: Diagnosis not present

## 2024-04-25 DIAGNOSIS — J440 Chronic obstructive pulmonary disease with acute lower respiratory infection: Secondary | ICD-10-CM | POA: Diagnosis present

## 2024-04-25 DIAGNOSIS — J8489 Other specified interstitial pulmonary diseases: Secondary | ICD-10-CM | POA: Diagnosis present

## 2024-04-25 DIAGNOSIS — J189 Pneumonia, unspecified organism: Principal | ICD-10-CM | POA: Diagnosis present

## 2024-04-25 DIAGNOSIS — Z7982 Long term (current) use of aspirin: Secondary | ICD-10-CM

## 2024-04-25 DIAGNOSIS — J441 Chronic obstructive pulmonary disease with (acute) exacerbation: Secondary | ICD-10-CM

## 2024-04-25 DIAGNOSIS — H409 Unspecified glaucoma: Secondary | ICD-10-CM | POA: Diagnosis present

## 2024-04-25 DIAGNOSIS — Z8249 Family history of ischemic heart disease and other diseases of the circulatory system: Secondary | ICD-10-CM

## 2024-04-25 DIAGNOSIS — R0602 Shortness of breath: Secondary | ICD-10-CM | POA: Diagnosis present

## 2024-04-25 DIAGNOSIS — J9601 Acute respiratory failure with hypoxia: Principal | ICD-10-CM | POA: Diagnosis present

## 2024-04-25 DIAGNOSIS — J45909 Unspecified asthma, uncomplicated: Secondary | ICD-10-CM | POA: Diagnosis present

## 2024-04-25 DIAGNOSIS — Z79890 Hormone replacement therapy (postmenopausal): Secondary | ICD-10-CM | POA: Diagnosis not present

## 2024-04-25 DIAGNOSIS — E7800 Pure hypercholesterolemia, unspecified: Secondary | ICD-10-CM | POA: Diagnosis present

## 2024-04-25 LAB — CBC WITH DIFFERENTIAL/PLATELET
Abs Immature Granulocytes: 0.02 K/uL (ref 0.00–0.07)
Basophils Absolute: 0 K/uL (ref 0.0–0.1)
Basophils Relative: 1 %
Eosinophils Absolute: 0 K/uL (ref 0.0–0.5)
Eosinophils Relative: 0 %
HCT: 40.8 % (ref 36.0–46.0)
Hemoglobin: 13.5 g/dL (ref 12.0–15.0)
Immature Granulocytes: 0 %
Lymphocytes Relative: 26 %
Lymphs Abs: 1.4 K/uL (ref 0.7–4.0)
MCH: 30.3 pg (ref 26.0–34.0)
MCHC: 33.1 g/dL (ref 30.0–36.0)
MCV: 91.7 fL (ref 80.0–100.0)
Monocytes Absolute: 0.5 K/uL (ref 0.1–1.0)
Monocytes Relative: 10 %
Neutro Abs: 3.5 K/uL (ref 1.7–7.7)
Neutrophils Relative %: 63 %
Platelets: 195 K/uL (ref 150–400)
RBC: 4.45 MIL/uL (ref 3.87–5.11)
RDW: 12.5 % (ref 11.5–15.5)
WBC: 5.5 K/uL (ref 4.0–10.5)
nRBC: 0 % (ref 0.0–0.2)

## 2024-04-25 LAB — TROPONIN I (HIGH SENSITIVITY): Troponin I (High Sensitivity): 2 ng/L (ref <18)

## 2024-04-25 LAB — RESPIRATORY PANEL BY PCR

## 2024-04-25 LAB — RESP PANEL BY RT-PCR (RSV, FLU A&B, COVID)  RVPGX2
Influenza A by PCR: NEGATIVE
Influenza B by PCR: NEGATIVE
Resp Syncytial Virus by PCR: NEGATIVE
SARS Coronavirus 2 by RT PCR: NEGATIVE

## 2024-04-25 LAB — TROPONIN T, HIGH SENSITIVITY: Troponin T High Sensitivity: <15 ng/L (ref <19)

## 2024-04-25 LAB — COMPREHENSIVE METABOLIC PANEL WITH GFR
ALT: 12 U/L (ref 0–44)
AST: 23 U/L (ref 15–41)
Albumin: 4.3 g/dL (ref 3.5–5.0)
Alkaline Phosphatase: 61 U/L (ref 38–126)
Anion gap: 12 (ref 5–15)
BUN: 11 mg/dL (ref 8–23)
CO2: 25 mmol/L (ref 22–32)
Calcium: 9.3 mg/dL (ref 8.9–10.3)
Chloride: 103 mmol/L (ref 98–111)
Creatinine, Ser: 0.88 mg/dL (ref 0.44–1.00)
GFR, Estimated: >60 mL/min (ref >60)
Glucose, Bld: 95 mg/dL (ref 70–99)
Potassium: 4.4 mmol/L (ref 3.5–5.1)
Sodium: 140 mmol/L (ref 135–145)
Total Bilirubin: 0.7 mg/dL (ref 0.0–1.2)
Total Protein: 6.9 g/dL (ref 6.5–8.1)

## 2024-04-25 LAB — LACTIC ACID, PLASMA: Lactic Acid, Venous: 1.4 mmol/L (ref 0.5–1.9)

## 2024-04-25 LAB — PRO BRAIN NATRIURETIC PEPTIDE: Pro Brain Natriuretic Peptide: 89.1 pg/mL (ref <300.0)

## 2024-04-25 MED ORDER — ACETAMINOPHEN 650 MG RE SUPP: 650.0000 mg | Freq: Four times a day (QID) | RECTAL | Status: DC | PRN

## 2024-04-25 MED ORDER — FLUTICASONE FUROATE-VILANTEROL 200-25 MCG/ACT IN AEPB
1.0000 | INHALATION_SPRAY | Freq: Every day | RESPIRATORY_TRACT | Status: DC
  Administered 2024-04-26 – 2024-04-27 (×2): 1 via RESPIRATORY_TRACT
  Filled 2024-04-25: qty 28

## 2024-04-25 MED ORDER — ONDANSETRON HCL 4 MG/2ML IJ SOLN: 4.0000 mg | Freq: Four times a day (QID) | INTRAMUSCULAR | Status: DC | PRN

## 2024-04-25 MED ORDER — LEVOTHYROXINE SODIUM 50 MCG PO TABS
50.0000 ug | ORAL_TABLET | ORAL | Status: DC
  Administered 2024-04-27: 50 ug via ORAL
  Filled 2024-04-25: qty 1

## 2024-04-25 MED ORDER — ASPIRIN 81 MG PO CHEW
81.0000 mg | CHEWABLE_TABLET | Freq: Every day | ORAL | Status: DC
  Administered 2024-04-25 – 2024-04-26 (×2): 81 mg via ORAL
  Filled 2024-04-25 (×2): qty 1

## 2024-04-25 MED ORDER — ATORVASTATIN CALCIUM 20 MG PO TABS
20.0000 mg | ORAL_TABLET | Freq: Every day | ORAL | Status: DC
  Administered 2024-04-25 – 2024-04-26 (×2): 20 mg via ORAL
  Filled 2024-04-25 (×2): qty 1

## 2024-04-25 MED ORDER — ENOXAPARIN SODIUM 40 MG/0.4ML IJ SOSY
40.0000 mg | PREFILLED_SYRINGE | INTRAMUSCULAR | Status: DC
  Administered 2024-04-25 – 2024-04-26 (×2): 40 mg via SUBCUTANEOUS
  Filled 2024-04-25 (×2): qty 0.4

## 2024-04-25 MED ORDER — LEVOTHYROXINE SODIUM 25 MCG PO TABS: 25.0000 ug | ORAL_TABLET | ORAL | Status: DC

## 2024-04-25 MED ORDER — SODIUM CHLORIDE 0.9 % IV SOLN
1.0000 g | INTRAVENOUS | Status: DC
  Administered 2024-04-26: 1 g via INTRAVENOUS
  Filled 2024-04-25: qty 10

## 2024-04-25 MED ORDER — SODIUM CHLORIDE 0.9 % IV SOLN
500.0000 mg | Freq: Once | INTRAVENOUS | Status: AC
  Administered 2024-04-25: 500 mg via INTRAVENOUS
  Filled 2024-04-25: qty 5

## 2024-04-25 MED ORDER — SODIUM CHLORIDE 0.9 % IV SOLN
500.0000 mg | INTRAVENOUS | Status: DC
  Administered 2024-04-26: 500 mg via INTRAVENOUS
  Filled 2024-04-25: qty 5

## 2024-04-25 MED ORDER — LEVOTHYROXINE SODIUM 25 MCG PO TABS
25.0000 ug | ORAL_TABLET | ORAL | Status: DC
  Administered 2024-04-26: 25 ug via ORAL
  Filled 2024-04-25: qty 1

## 2024-04-25 MED ORDER — ATOGEPANT 10 MG PO TABS: 10.0000 mg | ORAL_TABLET | ORAL | Status: DC

## 2024-04-25 MED ORDER — IPRATROPIUM-ALBUTEROL 0.5-2.5 (3) MG/3ML IN SOLN: 6.0000 mL | Freq: Once | RESPIRATORY_TRACT | Status: DC

## 2024-04-25 MED ORDER — IPRATROPIUM-ALBUTEROL 0.5-2.5 (3) MG/3ML IN SOLN
3.0000 mL | Freq: Once | RESPIRATORY_TRACT | Status: AC
  Administered 2024-04-25: 3 mL via RESPIRATORY_TRACT
  Filled 2024-04-25: qty 3

## 2024-04-25 MED ORDER — ALBUTEROL SULFATE (2.5 MG/3ML) 0.083% IN NEBU: 2.5000 mg | INHALATION_SOLUTION | RESPIRATORY_TRACT | Status: DC | PRN

## 2024-04-25 MED ORDER — ENSURE PLUS HIGH PROTEIN PO LIQD: 237.0000 mL | Freq: Two times a day (BID) | ORAL | Status: DC

## 2024-04-25 MED ORDER — IPRATROPIUM-ALBUTEROL 0.5-2.5 (3) MG/3ML IN SOLN
RESPIRATORY_TRACT | Status: AC
  Administered 2024-04-25: 6 mL
  Filled 2024-04-25: qty 6

## 2024-04-25 MED ORDER — ROPINIROLE HCL 0.25 MG PO TABS
0.5000 mg | ORAL_TABLET | Freq: Every day | ORAL | Status: DC
  Administered 2024-04-25 – 2024-04-26 (×2): 0.5 mg via ORAL
  Filled 2024-04-25 (×2): qty 2

## 2024-04-25 MED ORDER — TRAZODONE HCL 100 MG PO TABS
100.0000 mg | ORAL_TABLET | Freq: Every day | ORAL | Status: DC
  Administered 2024-04-25 – 2024-04-26 (×2): 100 mg via ORAL
  Filled 2024-04-25 (×2): qty 1

## 2024-04-25 MED ORDER — DORZOLAMIDE HCL 2 % OP SOLN
1.0000 [drp] | Freq: Two times a day (BID) | OPHTHALMIC | Status: DC
  Administered 2024-04-26 – 2024-04-27 (×2): 1 [drp] via OPHTHALMIC
  Filled 2024-04-25: qty 10

## 2024-04-25 MED ORDER — ACETAMINOPHEN 325 MG PO TABS
650.0000 mg | ORAL_TABLET | Freq: Four times a day (QID) | ORAL | Status: DC | PRN
  Administered 2024-04-25 – 2024-04-27 (×2): 650 mg via ORAL
  Filled 2024-04-25 (×2): qty 2

## 2024-04-25 MED ORDER — METHYLPREDNISOLONE SODIUM SUCC 125 MG IJ SOLR
125.0000 mg | Freq: Once | INTRAMUSCULAR | Status: AC
  Administered 2024-04-25: 125 mg via INTRAVENOUS
  Filled 2024-04-25: qty 2

## 2024-04-25 MED ORDER — PANTOPRAZOLE SODIUM 40 MG PO TBEC
40.0000 mg | DELAYED_RELEASE_TABLET | Freq: Every day | ORAL | Status: DC
  Administered 2024-04-26 – 2024-04-27 (×2): 40 mg via ORAL
  Filled 2024-04-25 (×2): qty 1

## 2024-04-25 MED ORDER — SODIUM CHLORIDE 0.9 % IV SOLN
1.0000 g | Freq: Once | INTRAVENOUS | Status: AC
  Administered 2024-04-25: 1 g via INTRAVENOUS
  Filled 2024-04-25: qty 10

## 2024-04-25 MED ORDER — ONDANSETRON HCL 4 MG PO TABS: 4.0000 mg | ORAL_TABLET | Freq: Four times a day (QID) | ORAL | Status: DC | PRN

## 2024-04-25 NOTE — Telephone Encounter (Signed)
 Thank you for letting me know

## 2024-04-25 NOTE — ED Notes (Signed)
 Azithromycin  handed to [facility] for continued abt treatment.  Per [facility] will hang on route when ceftriaxone is done

## 2024-04-25 NOTE — ED Notes (Signed)
 X-ray at bedside

## 2024-04-25 NOTE — ED Notes (Signed)
 Called Karen at [facility] for transport

## 2024-04-25 NOTE — Telephone Encounter (Signed)
 FYI Only or Action Required?: FYI only for provider  Patient is followed in Pulmonology for COPD, last seen on 08/06/2023 by Denson Flake, MD. Called Nurse Triage reporting No chief complaint on file.. Symptoms began several days ago. Interventions attempted: OTC medications: Mucinex, Cough Syrup. Symptoms are: gradually worsening.  Triage Disposition: Go to ED Now (Notify PCP)  Patient/caregiver understands and will follow disposition?: Yes    Copied from CRM [id number]. Topic: Clinical - Red Word Triage >> Apr 25, 2024  8:05 AM Crist Dominion wrote: Red Word that prompted transfer to Nurse Triage: Chest tightness and wheezing with a crackling feeling. Reason for Disposition  Oxygen level (e.g., pulse oximetry) 90 percent or lower  Answer Assessment - Initial Assessment Questions 1. RESPIRATORY STATUS: Describe your breathing? (e.g., wheezing, shortness of breath, unable to speak, severe coughing)      Wheezing,   2. ONSET: When did this breathing problem begin?      Several Days Ago  3. PATTERN Does the difficult breathing come and go, or has it been constant since it started?      Constant  4. SEVERITY: How bad is your breathing? (e.g., mild, moderate, severe)    - MILD: No SOB at rest, mild SOB with walking, speaks normally in sentences, can lie down, no retractions, pulse < 100.    - MODERATE: SOB at rest, SOB with minimal exertion and prefers to sit, cannot lie down flat, speaks in phrases, mild retractions, audible wheezing, pulse 100-120.    - SEVERE: Very SOB at rest, speaks in single words, struggling to breathe, sitting hunched forward, retractions, pulse > 120      Moderate  5. RECURRENT SYMPTOM: Have you had difficulty breathing before? If Yes, ask: When was the last time? and What happened that time?      No  6. CARDIAC HISTORY: Do you have any history of heart disease? (e.g., heart attack, angina, bypass surgery, angioplasty)      No  7. LUNG HISTORY:  Do you have any history of lung disease?  (e.g., pulmonary embolus, asthma, emphysema)     COPD, Bronchitis  8. CAUSE: What do you think is causing the breathing problem?      Congestion  9. OTHER SYMPTOMS: Do you have any other symptoms? (e.g., dizziness, runny nose, cough, chest pain, fever)     Cough, Fever (Broke), Chest Tightness, Runny Nose  10. O2 SATURATION MONITOR:  Do you use an oxygen saturation monitor (pulse oximeter) at home? If Yes, ask: What is your reading (oxygen level) today? What is your usual oxygen saturation reading? (e.g., 95%)       90, usually over 95.  Does not wear oxygen  11. PREGNANCY: Is there any chance you are pregnant? When was your last menstrual period?       No and No  12. TRAVEL: Have you traveled out of the country in the last month? (e.g., travel history, exposures)       A Week ago Sunday, Birthday party at her house, Reagan Camera was sick.  Protocols used: Breathing Difficulty-A-AH

## 2024-04-25 NOTE — Telephone Encounter (Signed)
FYI Dr. Byrum  

## 2024-04-25 NOTE — TOC Initial Note (Addendum)
 Transition of Care [facility]) - Initial/Assessment Note    Patient Details  Name: [name] MRN: [id number] Date of Birth: [birth year]  [facility] CM/SW Contact:    Ruben Corolla, RN Phone Number: 04/25/2024, 3:19 PM  Clinical Narrative: On 02 monitor if needed @ home.d/c plan home.                  Expected Discharge Plan: Home/Self Care Barriers to Discharge: Continued Medical Work up   Patient Goals and CMS Choice Patient states their goals for this hospitalization and ongoing recovery are:: Home CMS Medicare.gov Compare Post Acute Care list provided to:: Patient Represenative (must comment) ([name]) Choice offered to / list presented to : Adult Children [place] ownership interest in [facility].provided to:: Adult Children    Expected Discharge Plan and Services   Discharge Planning Services: CM Consult Post Acute Care Choice: Resumption of Svcs/PTA Provider Living arrangements for the past 2 months: Single Family Home                                      Prior Living Arrangements/Services Living arrangements for the past 2 months: Single Family Home Lives with:: Self   Do you feel safe going back to the place where you live?: Yes          Current home services: DME (cane)    Activities of Daily Living      Permission Sought/Granted Permission sought to share information with : Case Manager Permission granted to share information with : Yes, Verbal Permission Granted  Share Information with NAME: Case Manager           Emotional Assessment              Admission diagnosis:  Acute respiratory failure with hypoxia (HCC) [J96.01] Patient Active Problem List   Diagnosis Date Noted   Acute respiratory failure with hypoxia (HCC) 04/25/2024   Viral warts 06/03/2022   Cough 07/24/2021   GERD (gastroesophageal reflux disease) 02/14/2021   Influenza-like illness 01/10/2019   Sinusitis, acute maxillary 08/27/2018    Acute bronchitis 01/29/2017   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 AT CORNER OF [facility] [address] [place] [address] Phone: [phone] Fax: [phone]  [facility] - [place], [place] - [address] [place] [address] Phone: [phone] Fax: [phone]  [id number] [place], [place] - [address] AT [facility] [address] [place] [address] Phone: [phone] Fax: [phone]  [id number] - [place], [place] - [address] [place] [address] Phone: [phone] Fax: [phone]  [facility] [address] [place] [address] Phone: [phone] Fax: [phone]     Social Drivers of Health (SDOH) Social History: SDOH Screenings   Tobacco Use: Medium Risk (04/25/2024)   SDOH Interventions:     Readmission Risk Interventions     No data to display

## 2024-04-25 NOTE — ED Provider Notes (Signed)
 [name] Provider Note   CSN: [id number] Arrival date & time: 04/25/24  3875     Patient presents with: Nasal Congestion   [name] is a 83 y.o. female.   HPI     Started as a head cold, last Tuesday then by Friday started hearing the crackling in the chest and wheezing and especially at night when trying to sleep  Shortness of breath increasing over the weekend Called Dr. Baldwin Levee office and recommended come in No chest pain or fever, feels alittle tight, maybe  alittle different than COPD Last summer had bronchitis and pneumonia and feels like that No leg swelling or painS COngestion better      Past Medical History:  Diagnosis Date   Other acute sinusitis    Other diseases of nasal cavity and sinuses(478.19)    Pure hypercholesterolemia    Unspecified asthma(493.90)      Prior to Admission medications   Medication Sig Start Date End Date Taking? Authorizing Provider  AFRIN NODRIP EXTRA MOISTURE 0.05 % nasal spray Place 1 spray into both nostrils 2 (two) times daily as needed for congestion.   Yes [provider]  albuterol  (VENTOLIN  HFA) 108 (90 Base) MCG/ACT inhaler Inhale 2 puffs into the lungs 4 (four) times daily as needed for shortness of breath or wheezing.   Yes [provider]  aspirin 81 MG chewable tablet Chew 81 mg by mouth at bedtime.   Yes [provider]  Atogepant  (QULIPTA ) 10 MG TABS Take 10 mg by mouth daily. Patient taking differently: Take 10 mg by mouth 2 (two) times a week. 12/10/23  Yes McCue, Camilo Cella, NP  atorvastatin (LIPITOR) 20 MG tablet Take 20 mg by mouth at bedtime.   Yes [provider]  Calcium Carb-Cholecalciferol (CALCIUM + D3 PO) Take 1 tablet by mouth daily.   Yes [provider]  Cholecalciferol (VITAMIN D-3 PO) Take 1 capsule by mouth daily.   Yes [provider]  dorzolamide (TRUSOPT) 2 % ophthalmic solution Place 1 drop into  both eyes in the morning and at bedtime. 08/27/18  Yes [provider]  levothyroxine (SYNTHROID) 50 MCG tablet Take 25-50 mcg by mouth See admin instructions. Take 50 mcg by mouth 30 minutes before breakfast on Sun/Mon/Wed/Fri/Sat and 25 mcg on Tues/Thurs   Yes [provider]  pantoprazole (PROTONIX) 40 MG tablet Take 40 mg by mouth daily before breakfast.   Yes [provider]  rOPINIRole (REQUIP) 0.5 MG tablet Take 0.5 mg by mouth at bedtime.   Yes [provider]  SYMBICORT 160-4.5 MCG/ACT inhaler Inhale 2 puffs into the lungs 2 (two) times daily.   Yes [provider]  traZODone (DESYREL) 100 MG tablet Take 100 mg by mouth at bedtime. 05/01/15  Yes [provider]  TYLENOL  500 MG tablet Take 500-1,000 mg by mouth every 6 (six) hours as needed for mild pain (pain score 1-3) (or headaches).   Yes [provider]  azithromycin  (ZITHROMAX ) 250 MG tablet Take 1 tablet (250 mg total) by mouth daily. Patient not taking: Reported on 04/25/2024 05/11/23   Aleck Hurdle, MD  benzonatate  (TESSALON ) 100 MG capsule Take 1 capsule (100 mg total) by mouth every 6 (six) hours as needed for cough. Patient not taking: Reported on 04/25/2024 07/17/21   Denson Flake, MD  mometasone  (NASONEX ) 50 MCG/ACT nasal spray Place 2 sprays into the nose daily as needed. Patient not taking: Reported on 04/25/2024 07/24/21  Denson Flake, MD  Spacer/Aero-Holding Chambers [facility] DIAMOND) MISC See admin instructions.    [provider]  topiramate  (TOPAMAX ) 25 MG tablet Take 1 tablet (25 mg total) by mouth at bedtime. Patient not taking: Reported on 04/25/2024 10/14/23   Johny Nap, NP    Allergies: Dorzolamide hcl-timolol mal, Percocet [oxycodone-acetaminophen ], Travoprost, Brimonidine tartrate, Oxycodone, and Topamax  [topiramate ]    Review of Systems  Updated Vital Signs BP (!) 141/73 (BP Location: Left Arm)   Pulse 77   Temp 98.4 F (36.9  C) (Oral)   Resp 20   Ht 5' 6.5 (1.689 m)   Wt 69.5 kg   SpO2 93%   BMI 24.36 kg/m   Physical Exam Vitals and nursing note reviewed.  Constitutional:      General: She is not in acute distress.    Appearance: She is well-developed. She is not diaphoretic.  HENT:     Head: Normocephalic and atraumatic.   Eyes:     Conjunctiva/sclera: Conjunctivae normal.    Cardiovascular:     Rate and Rhythm: Normal rate and regular rhythm.     Heart sounds: Normal heart sounds. No murmur heard.    No friction rub. No gallop.  Pulmonary:     Effort: Pulmonary effort is normal. No respiratory distress.     Breath sounds: Wheezing and rhonchi present. No rales.  Abdominal:     General: There is no distension.     Palpations: Abdomen is soft.     Tenderness: There is no abdominal tenderness. There is no guarding.   Musculoskeletal:        General: No tenderness.     Cervical back: Normal range of motion.   Skin:    General: Skin is warm and dry.     Findings: No erythema or rash.   Neurological:     Mental Status: She is alert and oriented to person, place, and time.     (all labs ordered are listed, but only abnormal results are displayed) Labs Reviewed  RESP PANEL BY RT-PCR (RSV, FLU A&B, COVID)  [id number]  CULTURE, BLOOD (ROUTINE X 2)  CULTURE, BLOOD (ROUTINE X 2)  RESPIRATORY PANEL BY PCR  CBC WITH DIFFERENTIAL/PLATELET  COMPREHENSIVE METABOLIC PANEL WITH GFR  PRO BRAIN NATRIURETIC PEPTIDE  LACTIC ACID, PLASMA  STREP PNEUMONIAE URINARY ANTIGEN  CBC  COMPREHENSIVE METABOLIC PANEL WITH GFR  TROPONIN T, HIGH SENSITIVITY  TROPONIN I (HIGH SENSITIVITY)    EKG: None  Radiology: DG Chest Portable 1 View Result Date: 04/25/2024 CLINICAL DATA:  Shortness of breath EXAM: PORTABLE CHEST 1 VIEW COMPARISON:  Chest radiograph dated 07/01/2023, CTA chest dated 07/01/2023 FINDINGS: Normal lung volumes. Medial left upper lung opacity likely corresponds to chronic-appearing  consolidation on on prior CTA. Left basilar patchy opacity. No pleural effusion or pneumothorax. Similar mildly enlarged cardiomediastinal silhouette. No acute osseous abnormality. IMPRESSION: 1. Left basilar patchy opacity, which may represent atelectasis, aspiration, or pneumonia. 2. Medial left upper lung opacity likely corresponds to chronic-appearing consolidation on prior CTA. Electronically Signed   By: Limin  Xu M.D.   On: 04/25/2024 12:11     Procedures   Medications Ordered in the ED  enoxaparin (LOVENOX) injection 40 mg (40 mg Subcutaneous Given 04/25/24 2122)  acetaminophen  (TYLENOL ) tablet 650 mg (650 mg Oral Given 04/25/24 1801)    Or  acetaminophen  (TYLENOL ) suppository 650 mg ( Rectal See Alternative 04/25/24 1801)  ondansetron  (ZOFRAN ) tablet 4 mg (has no administration in time range)    Or  ondansetron  (ZOFRAN ) injection 4 mg (has no administration in time range)  albuterol  (PROVENTIL ) (2.5 MG/3ML) 0.083% nebulizer solution 2.5 mg (has no administration in time range)  cefTRIAXone (ROCEPHIN) 1 g in sodium chloride 0.9 % 100 mL IVPB (has no administration in time range)  azithromycin  (ZITHROMAX ) 500 mg in sodium chloride 0.9 % 250 mL IVPB (has no administration in time range)  feeding supplement (ENSURE PLUS HIGH PROTEIN) liquid 237 mL (has no administration in time range)  fluticasone furoate-vilanterol (BREO ELLIPTA) 200-25 MCG/ACT 1 puff (has no administration in time range)  aspirin chewable tablet 81 mg (81 mg Oral Given 04/25/24 2121)  Atogepant  TABS 10 mg (10 mg Oral Not Given 04/25/24 1944)  atorvastatin (LIPITOR) tablet 20 mg (20 mg Oral Given 04/25/24 2121)  traZODone (DESYREL) tablet 100 mg (100 mg Oral Given 04/25/24 2121)  rOPINIRole (REQUIP) tablet 0.5 mg (0.5 mg Oral Given 04/25/24 2121)  pantoprazole (PROTONIX) EC tablet 40 mg (has no administration in time range)  dorzolamide (TRUSOPT) 2 % ophthalmic solution 1 drop (1 drop Both Eyes Patient Refused/Not Given  04/25/24 2123)  levothyroxine (SYNTHROID) tablet 25 mcg (has no administration in time range)  levothyroxine (SYNTHROID) tablet 50 mcg (has no administration in time range)  ipratropium-albuterol  (DUONEB) 0.5-2.5 (3) MG/3ML nebulizer solution (6 mLs  Given 04/25/24 0935)  methylPREDNISolone  sodium succinate (SOLU-MEDROL ) 125 mg/2 mL injection 125 mg (125 mg Intravenous Given 04/25/24 1207)  ipratropium-albuterol  (DUONEB) 0.5-2.5 (3) MG/3ML nebulizer solution 3 mL (3 mLs Nebulization Given 04/25/24 1209)  cefTRIAXone (ROCEPHIN) 1 g in sodium chloride 0.9 % 100 mL IVPB (1 g Intravenous New Bag/Given 04/25/24 1403)  azithromycin  (ZITHROMAX ) 500 mg in sodium chloride 0.9 % 250 mL IVPB (500 mg Intravenous New Bag/Given 04/25/24 1553)                                     83yo female with history of COPD FEV1 76%, ortanizing pneumonia who presents with concern nasal congestion, cough and shortness of breath and was found to be hypoxic down to 88% on room air.  Differential diagnosis for dyspnea includes ACS, PE, COPD exacerbation, CHF exacerbation, anemia, pneumonia, viral etiology such as COVID 19 infection, metabolic abnormality.   Labs completed and personally eval and interpreted by me show no leukocytosis, no anemia, normal lactic acid, negative COVID, influenza and RSV testing.  CMP with no clinically significant electrolyte abnormalities.  Troponin negative and have low suspicion for ACS.  BNP is within normal limits and doubt CHF.  Chest x-ray completed and evaluated by me shows basilar patchy opacity which clinically feels consistent with pneumonia.  She also has a left medial upper lung opacity which corresponds to the chronic appearing consolidation on her prior CTA.  Clinically suspect community-acquired pneumonia and COPD exacerbation.  Will admit to the hospital given acute hypoxic respiratory failure.  At this time given preceding viral symptoms of congestion, cough, have low clinical  suspicion for PE.  Given Solu-Medrol , DuoNebs, Rocephin and azithromycin .     Final diagnoses:  Acute respiratory failure with hypoxia (HCC)  Community acquired pneumonia of left lower lobe of lung  Chronic obstructive pulmonary disease, unspecified COPD type [facility])    ED Discharge Orders     None          Scarlette Currier, MD 04/25/24 2152

## 2024-04-25 NOTE — Consult Note (Signed)
 NAME:  [name], MRN:  [id number], DOB:  1941/06/19, LOS: 0 ADMISSION DATE:  04/25/2024, CONSULTATION DATE:  6/16 REFERRING MD:  Dr. Bonita Bussing, CHIEF COMPLAINT:  acute respiratory failure w/ hypoxia; pna   History of Present Illness:  Patient is a 83 yo F w/ pertinent PMH COPD (seen by Dr. Baldwin Levee), BOOP, GERD, HLD presents to [facility] on 6/16 w/ acute respiratory failure w/ hypoxia 2/2 pna.  Patient has been having progressively worsening symptoms of congestion and sob over the past several days. Denies chest pain or fever. Came to [facility] ED for further eval. On arrival afebrile and wbc 5.5. Sats low as 88% in ED requiring supplemental o2. CXR w/ left basilar patchy opacity and medial LUL opacity chronic appearing. BNP 89. LA 1.4. Covid, flu, rsv negative. Cultures obtained and started on rocephin and azithromycin . Admitted by [facility]. PCCM consulted.  Pertinent  Medical History   Past Medical History:  Diagnosis Date   Other acute sinusitis    Other diseases of nasal cavity and sinuses(478.19)    Pure hypercholesterolemia    Unspecified asthma(493.90)      Significant Hospital Events: Including procedures, antibiotic start and stop dates in addition to other pertinent events   6/16 admitted w/ pna; pccm consulted  Interim History / Subjective:  See above  Objective    Blood pressure (!) 157/86, pulse 85, temperature 98.6 F (37 C), resp. rate 13, height 5' 6.5 (1.689 m), weight 69.5 kg, SpO2 96%.       No intake or output data in the 24 hours ending 04/25/24 1753 Filed Weights   04/25/24 1544  Weight: 69.5 kg    Examination: General:  NAD HEENT: MM pink/moist Neuro: Aox3; MAE CV: s1s2, RRR, no m/r/g PULM:  dim clear BS bilaterally; room air GI: soft, bsx4 active  Extremities: warm/dry, no edema    Resolved problem list   Assessment and Plan   Possible left basilar PNA Hx of COPD Hx of BOOP s/p pneumonectomy 2007 Plan: -off [place]; sat goal >92% -rocephin/azithro for  possible cap -f/u urine strep; send rvp -pulm toiletry: IS/Flutter -on symbicort at home; will start breo ellipta; prn albuterol   Best Practice (right click and Reselect all SmartList Selections daily)   Per primary  Labs   CBC: Recent Labs  Lab 04/25/24 1200  WBC 5.5  NEUTROABS 3.5  HGB 13.5  HCT 40.8  MCV 91.7  PLT 195    Basic Metabolic Panel: Recent Labs  Lab 04/25/24 1200  NA 140  K 4.4  CL 103  CO2 25  GLUCOSE 95  BUN 11  CREATININE 0.88  CALCIUM 9.3   GFR: Estimated Creatinine Clearance: 47.1 mL/min (by C-G formula based on SCr of 0.88 mg/dL). Recent Labs  Lab 04/25/24 1200 04/25/24 1209  WBC 5.5  --   LATICACIDVEN  --  1.4    Liver Function Tests: Recent Labs  Lab 04/25/24 1200  AST 23  ALT 12  ALKPHOS 61  BILITOT 0.7  PROT 6.9  ALBUMIN 4.3   No results for input(s): LIPASE, AMYLASE in the last 168 hours. No results for input(s): AMMONIA in the last 168 hours.  ABG No results found for: PHART, PCO2ART, PO2ART, HCO3, TCO2, ACIDBASEDEF, O2SAT   Coagulation Profile: No results for input(s): INR, PROTIME in the last 168 hours.  Cardiac Enzymes: No results for input(s): CKTOTAL, CKMB, CKMBINDEX, TROPONINI in the last 168 hours.  HbA1C: No results found for: HGBA1C  CBG: No results for input(s): GLUCAP  in the last 168 hours.  Review of Systems:   Review of Systems  Constitutional:  Negative for fever.  HENT:  Positive for congestion.   Respiratory:  Positive for shortness of breath.   Cardiovascular:  Negative for chest pain.  Gastrointestinal:  Negative for abdominal pain, nausea and vomiting.     Past Medical History:  She,  has a past medical history of Other acute sinusitis, Other diseases of nasal cavity and sinuses(478.19), Pure hypercholesterolemia, and Unspecified asthma(493.90).   Surgical History:   Past Surgical History:  Procedure Laterality Date   CORNEAL TRANSPLANT     IR  RADIOLOGIST EVAL & MGMT  07/24/2023   PNEUMONECTOMY     for BOOP Right 1/3   ROTATOR CUFF REPAIR     bilateral   TONSILLECTOMY     twice     Social History:   reports that she has never smoked. She has been exposed to tobacco smoke. She has never used smokeless tobacco. She reports that she does not drink alcohol and does not use drugs.   Family History:  Her family history includes Breast cancer in her maternal aunt; Heart attack in her father. There is no history of Migraines.   Allergies Allergies  Allergen Reactions   Dorzolamide Hcl-Timolol Mal Other (See Comments) and Shortness Of Breath   Oxycodone-Acetaminophen  Other (See Comments) and Shortness Of Breath    Other reaction(s): Hypotension  acetaminophen  / oxycodone   Travoprost Other (See Comments)    INTOLERANT TO TRAVATAN - eyes red and burning  travoprost   Brimonidine Itching and Other (See Comments)    RED  brimonidine   Brimonidine Tartrate     Other reaction(s): red eyes   Brimonidine Tartrate Itching    brimonidine tartrate   Oxycodone Other (See Comments)    Made the B/P drop   Oxycodone Hcl    Percocet [Oxycodone-Acetaminophen ]     Rapid heart rate     Home Medications  Prior to Admission medications   Medication Sig Start Date End Date Taking? Authorizing Provider  albuterol  (VENTOLIN  HFA) 108 (90 Base) MCG/ACT inhaler Inhale 2 puffs into the lungs 4 (four) times daily as needed.    [provider]  aspirin 81 MG tablet Take 81 mg by mouth daily.    [provider]  Atogepant  (QULIPTA ) 10 MG TABS Take 10 mg by mouth daily. 12/10/23   Johny Nap, NP  atorvastatin (LIPITOR) 20 MG tablet     [provider]  azithromycin  (ZITHROMAX ) 250 MG tablet Take 1 tablet (250 mg total) by mouth daily. 05/11/23   Aleck Hurdle, MD  benzonatate  (TESSALON ) 100 MG capsule Take 1 capsule (100 mg total) by mouth every 6 (six) hours as needed for cough. 07/17/21   Denson Flake, MD   budesonide-formoterol (SYMBICORT) 160-4.5 MCG/ACT inhaler Inhale 2 puffs into the lungs 2 (two) times daily.    [provider]  calcium-vitamin D (OSCAL) 250-125 MG-UNIT per tablet Take 1 tablet by mouth daily.    [provider]  Cholecalciferol (VITAMIN D) 50 MCG (2000 UT) tablet 1 tablet    [provider]  dorzolamide (TRUSOPT) 2 % ophthalmic solution INSTILL 1 DROP INTO BOTH EYES TWICE A DAY 08/27/18   [provider]  mometasone  (NASONEX ) 50 MCG/ACT nasal spray Place 2 sprays into the nose daily as needed. 07/24/21   Byrum, Robert S, MD  mupirocin ointment Renita Carwin) 2 %  11/22/18   [provider]  Glynda Lash  Calcium 500 MG TABS     [provider]  pantoprazole (PROTONIX) 40 MG tablet Take 40 mg by mouth daily.    [provider]  rOPINIRole (REQUIP) 1 MG tablet Take 1 mg by mouth at bedtime.    [provider]  Spacer/Aero-Holding Chambers [facility] DIAMOND) MISC See admin instructions.    [provider]  SYNTHROID 50 MCG tablet TAKE 1 TABLET BY MOUTH ONCE EVERY MORNING ON AN EMPTY STOMACH 05/01/15   [provider]  topiramate  (TOPAMAX ) 25 MG tablet Take 1 tablet (25 mg total) by mouth at bedtime. 10/14/23   Johny Nap, NP  traZODone (DESYREL) 100 MG tablet Take 100 mg by mouth at bedtime. 05/01/15   [provider]     Critical care time: NA     [facility] 04/25/2024, 5:53 PM  Please see [email or web address] for pager details.  From 7A-7P if no response, please call [phone]. After hours, please call [facility]Link [phone].

## 2024-04-25 NOTE — ED Triage Notes (Signed)
 Pt c/o SHOB, congestion started w a head cold Tuesday, Friday it went into my chest w wheezing/ crackling. Advised she called pulmonologist, was told to come to ED for eval   Inspiratory/ expiratory wheezing heard by RRT

## 2024-04-25 NOTE — ED Notes (Signed)
 [facility] at bedside

## 2024-04-25 NOTE — H&P (Signed)
 History and Physical    Patient: [name] UEA:[id number] DOB: 03-Dec-1940 DOA: 04/25/2024 DOS: the patient was seen and examined on 04/25/2024 PCP: Faustina Hood, MD  Patient coming from: Home  Chief Complaint:  Chief Complaint  Patient presents with   Nasal Congestion   HPI: [name] is a 83 y.o. female with medical history significant of hyperlipidemia, Asthma,/COPD, Boop, influenza-like illness, GERD, viral warts who presented to the emergency department complaints of dyspnea, cough, wheezing and congestion.  The patient stated the last week she had a upper respiratory infection following a social gathering on Sunday 8 days ago.  There were multiple person getting the same symptoms.  She called the pulmonary office today who asked him to come to the emergency department. He denied fever, chills or hemoptysis.  No chest pain, palpitations, diaphoresis, PND, orthopnea or pitting edema of the lower extremities.  No abdominal pain, nausea, emesis, diarrhea, constipation, melena or hematochezia.  No flank pain, dysuria, frequency or hematuria.  No polyuria, polydipsia, polyphagia or blurred vision.   Lab work: CBC, CMP, troponin, BNP, lactic acid and coronavirus/influenza/RSV PCR test was negative.  Imaging: Portable 1 view chest radiograph showing left basilar patchy opacity, which may represent atelectasis, aspiration or pneumonia.  Medial left upper lung opacity likely corresponds to chronic appearing consolidation on prior CTA.   ED course: Initial vital signs were temperature 98.5 F, pulse 87, respiration 20, BP 158/85 mmHg O2 sat 98% on room air.  However the patient decreased to 88% while in the emergency department.  She was placed on nasal cannula oxygen at 2 LPM.  She received ceftriaxone and azithromycin .  Review of Systems: As mentioned in the history of present illness. All other systems reviewed and are negative. Past Medical History:  Diagnosis Date   Other acute  sinusitis    Other diseases of nasal cavity and sinuses(478.19)    Pure hypercholesterolemia    Unspecified asthma(493.90)    Past Surgical History:  Procedure Laterality Date   CORNEAL TRANSPLANT     IR RADIOLOGIST EVAL & MGMT  07/24/2023   PNEUMONECTOMY     for BOOP Right 1/3   ROTATOR CUFF REPAIR     bilateral   TONSILLECTOMY     twice   Social History:  reports that she has never smoked. She has been exposed to tobacco smoke. She has never used smokeless tobacco. She reports that she does not drink alcohol and does not use drugs.  Allergies  Allergen Reactions   Dorzolamide Hcl-Timolol Mal Other (See Comments)   Oxycodone-Acetaminophen  Other (See Comments) and Shortness Of Breath    Other reaction(s): Hypotension   Acetaminophen     Alphagan [Brimonidine] Itching and Other (See Comments)    RED   Brimonidine Tartrate     Other reaction(s): red eyes   Brimonidine Tartrate    Oxycodone    Oxycodone Hcl    Percocet [Oxycodone-Acetaminophen ]     Rapid heart rate   Travoprost     INTOLERANT TO TRAVATAN - eyes red and burning    Family History  Problem Relation Age of Onset   Heart attack Father    Breast cancer Maternal Aunt    Migraines Neg Hx     Prior to Admission medications   Medication Sig Start Date End Date Taking? Authorizing Provider  albuterol  (VENTOLIN  HFA) 108 (90 Base) MCG/ACT inhaler Inhale 2 puffs into the lungs 4 (four) times daily as needed.    [provider]  aspirin 81  MG tablet Take 81 mg by mouth daily.    [provider]  Atogepant  (QULIPTA ) 10 MG TABS Take 10 mg by mouth daily. 12/10/23   Johny Nap, NP  atorvastatin (LIPITOR) 20 MG tablet     [provider]  azithromycin  (ZITHROMAX ) 250 MG tablet Take 1 tablet (250 mg total) by mouth daily. 05/11/23   Aleck Hurdle, MD  benzonatate  (TESSALON ) 100 MG capsule Take 1 capsule (100 mg total) by mouth every 6 (six) hours as needed for cough. 07/17/21   Denson Flake,  MD  budesonide-formoterol (SYMBICORT) 160-4.5 MCG/ACT inhaler Inhale 2 puffs into the lungs 2 (two) times daily.    [provider]  calcium-vitamin D (OSCAL) 250-125 MG-UNIT per tablet Take 1 tablet by mouth daily.    [provider]  Cholecalciferol (VITAMIN D) 50 MCG (2000 UT) tablet 1 tablet    [provider]  dorzolamide (TRUSOPT) 2 % ophthalmic solution INSTILL 1 DROP INTO BOTH EYES TWICE A DAY 08/27/18   [provider]  mometasone  (NASONEX ) 50 MCG/ACT nasal spray Place 2 sprays into the nose daily as needed. 07/24/21   Byrum, Robert S, MD  mupirocin ointment Renita Carwin) 2 %  11/22/18   [provider]  Glynda Lash Calcium 500 MG TABS     [provider]  pantoprazole (PROTONIX) 40 MG tablet Take 40 mg by mouth daily.    [provider]  rOPINIRole (REQUIP) 1 MG tablet Take 1 mg by mouth at bedtime.    [provider]  Spacer/Aero-Holding Chambers [facility] DIAMOND) MISC See admin instructions.    [provider]  SYNTHROID 50 MCG tablet TAKE 1 TABLET BY MOUTH ONCE EVERY MORNING ON AN EMPTY STOMACH 05/01/15   [provider]  topiramate  (TOPAMAX ) 25 MG tablet Take 1 tablet (25 mg total) by mouth at bedtime. 10/14/23   Johny Nap, NP  traZODone (DESYREL) 100 MG tablet Take 100 mg by mouth at bedtime. 05/01/15   [provider]    Physical Exam: Vitals:   04/25/24 1315 04/25/24 1330 04/25/24 1345 04/25/24 1400  BP: 123/63 139/69 127/71 (!) 149/71  Pulse: 77 78 80 84  Resp: (!) 22 15 13    Temp:  98.5 F (36.9 C)    TempSrc:  Oral    SpO2: 93% 96% 94% 92%   Physical Exam Vitals and nursing note reviewed.  Constitutional:      Appearance: She is ill-appearing.  HENT:     Head: Normocephalic.     Nose: No rhinorrhea.     Mouth/Throat:     Mouth: Mucous membranes are moist.   Eyes:     General: No scleral icterus.    Pupils: Pupils are equal, round, and reactive to light.     Cardiovascular:     Rate and Rhythm: Normal rate and regular rhythm.  Pulmonary:     Breath sounds: Wheezing, rhonchi and rales present.  Abdominal:     General: There is no distension.     Palpations: Abdomen is soft.     Tenderness: There is no abdominal tenderness. There is no guarding.   Musculoskeletal:     Cervical back: Neck supple.     Right lower leg: No edema.     Left lower leg: No edema.   Skin:    General: Skin is warm and dry.   Neurological:     General: No focal deficit present.     Mental Status: She is alert and oriented  to person, place, and time.   Psychiatric:        Mood and Affect: Mood normal.        Behavior: Behavior normal.     Data Reviewed:  Results are pending, will review when available.  EKG: Vent. rate 78 BPM PR interval 138 ms QRS duration 84 ms QT/QTcB 390/445 ms P-R-T axes 71 -39 53 Sinus rhythm Probable left atrial enlargement Left axis deviation RSR' in V1 or V2, probably normal variant Minimal ST elevation, anterior leads  Assessment and Plan: Principal Problem:   Acute respiratory failure with hypoxia (HCC) Secondary to   Pneumonia of left lower lobe due to infectious organism In a patient with history of:   Asthma   COPD (chronic obstructive pulmonary disease) (HCC)   BOOP (bronchiolitis obliterans with organizing pneumonia) (HCC) Admit to SDU/inpatient. Continue supplemental oxygen. Scheduled and as needed bronchodilators. Continue ceftriaxone 1 g IVPB daily. Continue azithromycin  500 mg IVPB daily. Check strep pneumoniae urinary antigen. Check sputum Gram stain, culture and sensitivity. Follow-up blood culture and sensitivity. Follow-up CBC and chemistry in the morning.  Active Problems:   HYPERCHOLESTEROLEMIA Continue atorvastatin.    GERD (gastroesophageal reflux disease) Continue pantoprazole 40 mg p.o. daily.    Glaucoma Continue dorzolamide drops.    Advance Care Planning:   Code Status:  Full Code   Consults:   Family Communication:   Severity of Illness: The appropriate patient status for this patient is INPATIENT. Inpatient status is judged to be reasonable and necessary in order to provide the required intensity of service to ensure the patient's safety. The patient's presenting symptoms, physical exam findings, and initial radiographic and laboratory data in the context of their chronic comorbidities is felt to place them at high risk for further clinical deterioration. Furthermore, it is not anticipated that the patient will be medically stable for discharge from the hospital within 2 midnights of admission.   * I certify that at the point of admission it is my clinical judgment that the patient will require inpatient hospital care spanning beyond 2 midnights from the point of admission due to high intensity of service, high risk for further deterioration and high frequency of surveillance required.*  Author: Danice Dural, MD 04/25/2024 3:22 PM  For on call review [email or web address].   This document was prepared using Dragon voice recognition software and may contain some unintended transcription errors. \

## 2024-04-25 NOTE — Progress Notes (Signed)
 Plan of Care Note for accepted transfer   Patient: [name] MRN: [id number]   DOA: 04/25/2024  Facility [facility] transfer: [facility] Requesting Provider: Scarlette Currier, MD. Reason for transfer: Acute hypoxic respiratory failure. Facility course:  Acute respiratory failure with hypoxia due to pneumonia associated with COPD exacerbation.  The patient has received ceftriaxone, azithromycin , methylprednisolone  and 2 DuoNebs.  Plan of care: The patient is accepted for admission to Telemetry unit, at [facility].  Author: Danice Dural, MD 04/25/2024  Check [email or web address] for on-call coverage.  Nursing staff, Please call [facility] Admits & Consults System-Wide number on Amion as soon as patient's arrival, so appropriate admitting provider can evaluate the pt.

## 2024-04-26 ENCOUNTER — Ambulatory Visit: Payer: Medicare HMO | Admitting: Adult Health

## 2024-04-26 DIAGNOSIS — J9601 Acute respiratory failure with hypoxia: Secondary | ICD-10-CM | POA: Diagnosis not present

## 2024-04-26 LAB — COMPREHENSIVE METABOLIC PANEL WITH GFR
ALT: 12 U/L (ref 0–44)
AST: 17 U/L (ref 15–41)
Albumin: 3.9 g/dL (ref 3.5–5.0)
Alkaline Phosphatase: 48 U/L (ref 38–126)
Anion gap: 10 (ref 5–15)
BUN: 15 mg/dL (ref 8–23)
CO2: 25 mmol/L (ref 22–32)
Calcium: 8.7 mg/dL — ABNORMAL LOW (ref 8.9–10.3)
Chloride: 102 mmol/L (ref 98–111)
Creatinine, Ser: 0.73 mg/dL (ref 0.44–1.00)
GFR, Estimated: >60 mL/min (ref >60)
Glucose, Bld: 133 mg/dL — ABNORMAL HIGH (ref 70–99)
Potassium: 4.3 mmol/L (ref 3.5–5.1)
Sodium: 137 mmol/L (ref 135–145)
Total Bilirubin: 0.8 mg/dL (ref 0.0–1.2)
Total Protein: 6.5 g/dL (ref 6.5–8.1)

## 2024-04-26 LAB — CBC
HCT: 40.2 % (ref 36.0–46.0)
Hemoglobin: 12.9 g/dL (ref 12.0–15.0)
MCH: 30.1 pg (ref 26.0–34.0)
MCHC: 32.1 g/dL (ref 30.0–36.0)
MCV: 93.9 fL (ref 80.0–100.0)
Platelets: 207 10*3/uL (ref 150–400)
RBC: 4.28 MIL/uL (ref 3.87–5.11)
RDW: 12.4 % (ref 11.5–15.5)
WBC: 5 10*3/uL (ref 4.0–10.5)
nRBC: 0 % (ref 0.0–0.2)

## 2024-04-26 LAB — STREP PNEUMONIAE URINARY ANTIGEN: Strep Pneumo Urinary Antigen: NEGATIVE

## 2024-04-26 MED ORDER — CEFUROXIME AXETIL 500 MG PO TABS
500.0000 mg | ORAL_TABLET | Freq: Two times a day (BID) | ORAL | Status: DC
  Administered 2024-04-26 – 2024-04-27 (×2): 500 mg via ORAL
  Filled 2024-04-26 (×3): qty 1

## 2024-04-26 MED ORDER — PREDNISONE 20 MG PO TABS
20.0000 mg | ORAL_TABLET | Freq: Every day | ORAL | Status: DC
  Administered 2024-04-27: 20 mg via ORAL
  Filled 2024-04-26: qty 1

## 2024-04-26 MED ORDER — AZITHROMYCIN 250 MG PO TABS
250.0000 mg | ORAL_TABLET | Freq: Every day | ORAL | Status: DC
  Administered 2024-04-27: 250 mg via ORAL
  Filled 2024-04-26: qty 1

## 2024-04-26 NOTE — Progress Notes (Signed)
   NAME:  [name], MRN:  [id number], DOB:  [birth year], LOS: 1 ADMISSION DATE:  04/25/2024, CONSULTATION DATE: 6/16 REFERRING MD: Dr. Bonita Bussing, CHIEF COMPLAINT: Acute respiratory failure with hypoxia COVID-19 positive test (U07.1, COVID-19) with Acute Pneumonia (J12.89, Other viral pneumonia) (If respiratory failure or sepsis present, add as separate assessment)    History of Present Illness:  Patient is a 83 yo F w/ pertinent PMH COPD (seen by Dr. Baldwin Levee), BOOP, GERD, HLD presents to [facility] on 6/16 w/ acute respiratory failure w/ hypoxia 2/2 pna.   Patient has been having progressively worsening symptoms of congestion and sob over the past several days. Denies chest pain or fever. Came to [facility] ED for further eval. On arrival afebrile and wbc 5.5. Sats low as 88% in ED requiring supplemental o2. CXR w/ left basilar patchy opacity and medial LUL opacity chronic appearing. BNP 89. LA 1.4. Covid, flu, rsv negative. Cultures obtained and started on rocephin and azithromycin . Admitted by [facility]. PCCM consulted.  Pertinent  Medical History   Past Medical History:  Diagnosis Date   Other acute sinusitis    Other diseases of nasal cavity and sinuses(478.19)    Pure hypercholesterolemia    Unspecified asthma(493.90)    Significant Hospital Events: Including procedures, antibiotic start and stop dates in addition to other pertinent events   6/16 admitted with pneumonia, PCCM consulted  Interim History / Subjective:  Denies any pain or discomfort Breathing feels a little bit better  Objective    Blood pressure 134/63, pulse 72, temperature 98 F (36.7 C), temperature source Oral, resp. rate 20, height 5' 6.5 (1.689 m), weight 69.5 kg, SpO2 91%.        Intake/Output Summary (Last 24 hours) at 04/26/2024 1000 Last data filed at 04/25/2024 2100 Gross per 24 hour  Intake 589.41 ml  Output --  Net 589.41 ml   Filed Weights   04/25/24 1544  Weight: 69.5 kg    Examination: General: Elderly,  does not appear to be in distress HENT: Moist oral mucosa Lungs: Decreased air movement bilaterally Cardiovascular: S1-S2 appreciated Abdomen: Soft, bowel sounds appreciated Extremities: Warm dry Neuro: Alert and oriented x 3 GU:   Resolved problem list   Assessment and Plan   Left basilar pneumonia History of chronic obstructive pulmonary disease  Past history of boob s/p lobectomy in 2007  Continue oxygen supplementation as needed  Continue antibiotic therapy for possible community-acquired pneumonia follow Legionella and strep pneumo antigens Will switch to oral azithromycin , Ceftin and prednisone   Pulmonary toileting   Myer Artis, MD [place] PCCM Pager: See Tilford Foley

## 2024-04-26 NOTE — Plan of Care (Signed)
  Problem: Clinical Measurements: Goal: Respiratory complications will improve Outcome: Progressing Goal: Cardiovascular complication will be avoided Outcome: Progressing   Problem: Activity: Goal: Risk for activity intolerance will decrease Outcome: Progressing   Problem: Coping: Goal: Level of anxiety will decrease Outcome: Progressing   Problem: Elimination: Goal: Will not experience complications related to urinary retention Outcome: Progressing   Problem: Safety: Goal: Ability to remain free from injury will improve Outcome: Progressing

## 2024-04-26 NOTE — Progress Notes (Signed)
 PROGRESS NOTE    [name]  ZOX:[id number] DOB: 1941/04/10 DOA: 04/25/2024 PCP: Faustina Hood, MD     Brief Narrative:  [name] is an 83 y.o. female with medical history significant of hyperlipidemia, Asthma/COPD, BOOP, influenza-like illness, GERD, viral warts who presented to the emergency department complaints of dyspnea, cough, wheezing and congestion. The patient stated the last week she had a upper respiratory infection following a social gathering on Sunday 8 days ago. There were multiple person getting the same symptoms. She called the pulmonary office today who asked him to come to the emergency department.   Portable 1 view chest radiograph showing left basilar patchy opacity, which may represent atelectasis, aspiration or pneumonia. Medial left upper lung opacity likely corresponds to chronic appearing consolidation on prior CTA.   New events last 24 hours / Subjective: Patient sitting in a recliner this morning. Her breathing has improved.   Assessment & Plan:   Principal Problem:   Acute respiratory failure with hypoxia (HCC) Active Problems:   HYPERCHOLESTEROLEMIA   Asthma   COPD (chronic obstructive pulmonary disease) (HCC)   BOOP (bronchiolitis obliterans with organizing pneumonia) (HCC)   GERD (gastroesophageal reflux disease)   Pneumonia of left lower lobe due to infectious organism   Acute hypoxemic respiratory failure CAP Hx of COPD, BOOP s/p lobectomy  - Followed by outpatient pulmonology Dr. Byrum  - Covid, flu, RSV, respiratory viral panel  negative - Step pneumo Ag negative  - Desat to 88% on room air, required 2L O2 - weaned now back on room air  - Continue rocephin, azithromycin --> ceftin, azithromycin, prednisone  - PCCM following  HLD - Lipitor  GERD - PPI  Hypothyroidism - Synthroid    DVT prophylaxis: enoxaparin (LOVENOX) injection 40 mg Start: 04/25/24 2200 Code Status: Full Family Communication: Called daughter and  left her a voicemail to call us back for updates  Disposition Plan: Home Status is: Inpatient Remains inpatient appropriate because: IV antibiotics     Antimicrobials:  Anti-infectives (From admission, onward)    Start     Dose/Rate Route Frequency Ordered Stop   04/26/24 1700  cefUROXime (CEFTIN) tablet 500 mg        500 mg Oral 2 times daily with meals 04/26/24 1022 05/02/24 1659   04/26/24 1115  azithromycin (ZITHROMAX) tablet 250 mg        250 mg Oral Daily 04/26/24 1022 04/30/24 0959   04/26/24 1000  cefTRIAXone (ROCEPHIN) 1 g in sodium chloride 0.9 % 100 mL IVPB  Status:  Discontinued        1 g 200 mL/hr over 30 Minutes Intravenous Every 24 hours 04/25/24 1534 04/26/24 1022   04/26/24 1000  azithromycin (ZITHROMAX) 500 mg in sodium chloride 0.9 % 250 mL IVPB  Status:  Discontinued        500 mg 250 mL/hr over 60 Minutes Intravenous Every 24 hours 04/25/24 1534 04/26/24 1022   04/25/24 1330  cefTRIAXone (ROCEPHIN) 1 g in sodium chloride 0.9 % 100 mL IVPB        1 g 200 mL/hr over 30 Minutes Intravenous  Once 04/25/24 1321 04/25/24 1433   04/25/24 1330  azithromycin (ZITHROMAX) 500 mg in sodium chloride 0.9 % 250 mL IVPB        50 0 mg 250 mL/hr over 60 Minutes Intravenous  Once 04/25/24 1321 04/25/24 1653        Objective: Vitals:   04/25/24 1544 04/25/24 2031 04/25/24 2349 04/26/24 0323  BP: Aaron Aas)  157/86 (!) 141/73 126/74 134/63  Pulse: 85 77 79 72  Resp:  20 20 20   Temp: 98.6 F (37 C) 98.4 F (36.9 C) (!) 97.5 F (36.4 C) 98 F (36.7 C)  TempSrc:  Oral Oral Oral  SpO2: 96% 93% 93% 91%  Weight: 69.5 kg     Height: 5' 6.5 (1.689 m)       Intake/Output Summary (Last 24 hours) at 04/26/2024 1219 Last data filed at 04/26/2024 0900 Gross per 24 hour  Intake 709.41 ml  Output --  Net 709.41 ml   Filed Weights   04/25/24 1544  Weight: 69.5 kg    Examination:  General exam: Appears calm and comfortable  Respiratory system: Diminished breath sounds, no  distress, on room air  Cardiovascular system: S1 & S2 heard, RRR. No murmurs. No pedal edema. Gastrointestinal system: Abdomen is nondistended, soft and nontender. Normal bowel sounds heard. Central nervous system: Alert and oriented. No focal neurological deficits. Speech clear.  Extremities: Symmetric in appearance  Skin: No rashes, lesions or ulcers on exposed skin  Psychiatry: Judgement and insight appear normal. Mood & affect appropriate.   Data Reviewed: I have personally reviewed following labs and imaging studies  CBC: Recent Labs  Lab 04/25/24 1200 04/26/24 0534  WBC 5.5 5.0  NEUTROABS 3.5  --   HGB 13.5 12.9  HCT 40.8 40.2  MCV 91.7 93.9  PLT 195 207   Basic Metabolic Panel: Recent Labs  Lab 04/25/24 1200 04/26/24 0534  NA 140 137  K 4.4 4.3  CL 103 102  CO2 25 25  GLUCOSE 95 133*  BUN 11 15  CREATININE 0.88 0.73  CALCIUM 9.3 8.7*   GFR: Estimated Creatinine Clearance: 51.8 mL/min (by C-G formula based on SCr of 0.73 mg/dL). Liver Function Tests: Recent Labs  Lab 04/25/24 1200 04/26/24 0534  AST 23 17  ALT 12 12  ALKPHOS 61 48  BILITOT 0.7 0.8  PROT 6.9 6.5  ALBUMIN 4.3 3.9   No results for input(s): LIPASE, AMYLASE in the last 168 hours. No results for input(s): AMMONIA in the last 168 hours. Coagulation Profile: No results for input(s): INR, PROTIME in the last 168 hours. Cardiac Enzymes: No results for input(s): CKTOTAL, CKMB, CKMBINDEX, TROPONINI in the last 168 hours. BNP (last 3 results) Recent Labs    04/25/24 1200  PROBNP 89.1   HbA1C: No results for input(s): HGBA1C in the last 72 hours. CBG: No results for input(s): GLUCAP in the last 168 hours. Lipid Profile: No results for input(s): CHOL, HDL, LDLCALC, TRIG, CHOLHDL, LDLDIRECT in the last 72 hours. Thyroid  Function Tests: No results for input(s): TSH, T4TOTAL, FREET4, T3FREE, THYROIDAB in the last 72 hours. Anemia Panel: No results  for input(s): VITAMINB12, FOLATE, FERRITIN, TIBC, IRON, RETICCTPCT in the last 72 hours. Sepsis Labs: Recent Labs  Lab 04/25/24 1209  LATICACIDVEN 1.4    Recent Results (from the past 240 hours)  Blood culture (routine x 2)     Status: None (Preliminary result)   Collection Time: 04/25/24 12:15 PM   Specimen: BLOOD  Result Value Ref Range Status   Specimen Description   Final    BLOOD RIGHT ANTECUBITAL Performed at [facility], [address], [place], [place] [address]    Special Requests   Final    Blood Culture adequate volume Performed at [facility], [address] [place], [place] [address]    Culture   Final    NO GROWTH < 24 HOURS  Performed at [facility], [address], [place], [place] [address]    Report Status PENDING  Incomplete  Resp panel by RT-PCR (RSV, Flu A&B, Covid) Anterior Nasal Swab     Status: None   Collection Time: 04/25/24 12:15 PM   Specimen: Anterior Nasal Swab  Result Value Ref Range Status   SARS Coronavirus 2 by RT PCR NEGATIVE NEGATIVE Final    Comment: (NOTE) SARS-CoV-2 target nucleic acids are NOT DETECTED.  The SARS-CoV-2 RNA is generally detectable in upper respiratory specimens during the acute phase of infection. The lowest concentration of SARS-CoV-2 viral copies this assay can detect is 138 copies/mL. A negative result does not preclude SARS-Cov-2 infection and should not be used as the sole basis for treatment or other patient management decisions. A negative result may occur with  improper specimen collection/handling, submission of specimen other than nasopharyngeal swab, presence of viral mutation(s) within the areas targeted by this assay, and inadequate number of viral copies(<138 copies/mL). A negative result must be combined with clinical observations, patient history, and epidemiological information. The expected result is Negative.  Fact Sheet for Patients:   BloggerCourse.com  Fact Sheet for Healthcare Providers:  SeriousBroker.it  This test is no t yet approved or cleared by the [place]  FDA and  has been authorized for detection and/or diagnosis of SARS-CoV-2 by FDA under an Emergency Use Authorization (EUA). This EUA will remain  in effect (meaning this test can be used) for the duration of the COVID-19 declaration under Section 564(b)(1) of the Act, 21 U.S.C.section 360bbb-3(b)(1), unless the authorization is terminated  or revoked sooner.       Influenza A by PCR NEGATIVE NEGATIVE Final   Influenza B by PCR NEGATIVE NEGATIVE Final    Comment: (NOTE) The Xpert Xpress SARS-CoV-2/FLU/RSV plus assay is intended as an aid in the diagnosis of influenza from Nasopharyngeal swab specimens and should not be used as a sole basis for treatment. Nasal washings and aspirates are unacceptable for Xpert Xpress SARS-CoV-2/FLU/RSV testing.  Fact Sheet for Patients: BloggerCourse.com  Fact Sheet for Healthcare Providers: SeriousBroker.it  This test is not yet approved or cleared by the [place]  FDA and has been authorized for detection and/or diagnosis of SARS-CoV-2 by FDA under an Emergency Use Authorization (EUA). This EUA will remain in effect (meaning this test can be used) for the duration of the COVID-19 declaration under Section 564(b)(1) of the Act, 21 U.S.C. section 360bbb-3(b)(1), unless the authorization is terminated or revoked.     Resp Syncytial Virus by PCR NEGATIVE NEGATIVE Final    Comment: (NOTE) Fact Sheet for Patients: BloggerCourse.com  Fact Sheet for Healthcare Providers: SeriousBroker.it  This test is not yet approved or cleared by the [place]  FDA and has been authorized for detection and/or diagnosis of SARS-CoV-2 by FDA under an Emergency Use  Authorization (EUA). This EUA will remain in effect (meaning this test can be used) for the duration of the COVID-19 declaration under Section 564(b)(1) of the Act, 21 U.S.C. section 360bbb-3(b)(1), unless the authorization is terminated or revoked.  Performed at [facility], [address], [place], [place] [address]   Blood culture (routine x 2)     Status: None (Preliminary result)   Collection Time: 04/25/24  2:05 PM   Specimen: BLOOD  Result Value Ref Range Status   Specimen Description   Final    BLOOD LEFT ANTECUBITAL Performed at [facility], [address], [place], [place] [address]  Special Requests   Final    BOTTLES DRAWN AEROBIC AND ANAEROBIC Blood Culture adequate volume Performed at [facility], [address] [place], [place] [address]    Culture   Final    NO GROWTH < 24 HOURS Performed at [facility], [address], [place], [place] [address]    Report Status PENDING  Incomplete  Respiratory (~20 pathogens) panel by PCR     Status: None   Collection Time: 04/25/24  6:24 PM   Specimen: Nasopharyngeal Swab; Respiratory  Result Value Ref Range Status   Adenovirus NOT DETECTED NOT DETECTED Final   Coronavirus 229E NOT DETECTED NOT DETECTED Final    Comment: (NOTE) The Coronavirus on the Respiratory Panel, DOES NOT test for the novel  Coronavirus (2019 nCoV)    Coronavirus HKU1 NOT DETECTED NOT DETECTED Final   Coronavirus NL63 NOT DETECTED NOT DETECTED Final   Coronavirus OC43 NOT DETECTED NOT DETECTED Final   Metapneumovirus NOT DETECTED NOT DETECTED Final   Rhinovirus / Enterovirus NOT DETECTED NOT DETECTED Final   Influenza A NOT DETECTED NOT DETECTED Final   Influenza B NOT DETECTED NOT DETECTED Final   Parainfluenza Virus 1 NOT DETECTED NOT DETECTED Final   Parainfluenza Virus 2 NOT DETECTED NOT DETECTED Final   Parainfluenza Virus 3 NOT DETECTED NOT DETECTED Final    Parainfluenza Virus 4 NOT DETECTED NOT DETECTED Final   Respiratory Syncytial Virus NOT DETECTED NOT DETECTED Final   Bordetella pertussis NOT DETECTED NOT DETECTED Final   Bordetella Parapertussis NOT DETECTED NOT DETECTED Final   Chlamydophila pneumoniae NOT DETECTED NOT DETECTED Final   Mycoplasma pneumoniae NOT DETECTED NOT DETECTED Final    Comment: Performed at [facility], [address] [place], [place] [address]      Radiology Studies: DG Chest Portable 1 View Result Date: 04/25/2024 CLINICAL DATA:  Shortness of breath EXAM: PORTABLE CHEST 1 VIEW COMPARISON:  Chest radiograph dated 07/01/2023, CTA chest dated 07/01/2023 FINDINGS: Normal lung volumes. Medial left upper lung opacity likely corresponds to chronic-appearing consolidation on on prior CTA. Left basilar patchy opacity. No pleural effusion or pneumothorax. Similar mildly enlarged cardiomediastinal silhouette. No acute osseous abnormality. IMPRESSION: 1. Left basilar patchy opacity, which may represent atelectasis, aspiration, or pneumonia. 2. Medial left upper lung opacity likely corresponds to chronic-appearing consolidation on prior CTA. Electronically Signed   By: Limin  Xu M.D.   On: 04/25/2024 12:11      Scheduled Meds:  aspirin  81 mg Oral QHS   Atogepant   10 mg Oral Once per day on Monday Thursday   atorvastatin  20 mg Oral QHS   azithromycin   250 mg Oral Daily   cefUROXime  500 mg Oral BID WC   dorzolamide  1 drop Both Eyes BID   enoxaparin (LOVENOX) injection  40 mg Subcutaneous Q24H   feeding supplement  237 mL Oral BID BM   fluticasone furoate-vilanterol  1 puff Inhalation Daily   levothyroxine  25 mcg Oral Once per day on Tuesday Thursday   [START ON 04/27/2024] levothyroxine  50 mcg Oral Once per day on Sunday Monday Wednesday Friday Saturday   pantoprazole  40 mg Oral QAC breakfast   [START ON 04/27/2024] predniSONE   20 mg Oral Q breakfast   rOPINIRole  0.5 mg Oral QHS   traZODone  100 mg Oral QHS    Continuous Infusions:   LOS: 1 day   Time spent: 35 minutes   Daren Eck, DO [facility] 04/26/2024,  12:19 PM   Available via Epic secure chat 7am-7pm After these hours, please refer to coverage provider listed on [email or web address]

## 2024-04-26 NOTE — Plan of Care (Signed)
  Problem: Education: Goal: Knowledge of General Education information will improve Description: Including pain rating scale, medication(s)/side effects and non-pharmacologic comfort measures Outcome: Progressing   Problem: Clinical Measurements: Goal: Ability to maintain clinical measurements within normal limits will improve Outcome: Progressing Goal: Will remain free from infection Outcome: Progressing   Problem: Respiratory: Goal: Ability to maintain adequate ventilation will improve Outcome: Progressing

## 2024-04-27 ENCOUNTER — Other Ambulatory Visit (HOSPITAL_COMMUNITY): Payer: Self-pay

## 2024-04-27 DIAGNOSIS — J449 Chronic obstructive pulmonary disease, unspecified: Secondary | ICD-10-CM | POA: Diagnosis not present

## 2024-04-27 DIAGNOSIS — K219 Gastro-esophageal reflux disease without esophagitis: Secondary | ICD-10-CM

## 2024-04-27 DIAGNOSIS — J189 Pneumonia, unspecified organism: Secondary | ICD-10-CM | POA: Diagnosis not present

## 2024-04-27 DIAGNOSIS — J9601 Acute respiratory failure with hypoxia: Secondary | ICD-10-CM | POA: Diagnosis not present

## 2024-04-27 DIAGNOSIS — J8489 Other specified interstitial pulmonary diseases: Secondary | ICD-10-CM

## 2024-04-27 MED ORDER — CEFUROXIME AXETIL 500 MG PO TABS
500.0000 mg | ORAL_TABLET | Freq: Two times a day (BID) | ORAL | 0 refills | Status: AC
  Filled 2024-04-27: qty 10, 5d supply, fill #0

## 2024-04-27 MED ORDER — ALBUTEROL SULFATE HFA 108 (90 BASE) MCG/ACT IN AERS: INHALATION_SPRAY | RESPIRATORY_TRACT | Status: AC

## 2024-04-27 MED ORDER — PREDNISONE 20 MG PO TABS
20.0000 mg | ORAL_TABLET | Freq: Every day | ORAL | 0 refills | Status: AC
  Filled 2024-04-27: qty 4, 4d supply, fill #0

## 2024-04-27 MED ORDER — AZITHROMYCIN 250 MG PO TABS
250.0000 mg | ORAL_TABLET | Freq: Every day | ORAL | 0 refills | Status: AC
  Filled 2024-04-27: qty 2, 2d supply, fill #0

## 2024-04-27 MED ORDER — ONDANSETRON HCL 4 MG PO TABS
4.0000 mg | ORAL_TABLET | Freq: Four times a day (QID) | ORAL | 0 refills | Status: AC | PRN
  Filled 2024-04-27: qty 20, 5d supply, fill #0

## 2024-04-27 NOTE — Discharge Summary (Signed)
 Physician Discharge Summary  PAISLEI DORVAL LOV:[id number] DOB: [birth year] DOA: 04/25/2024  PCP: Faustina Hood, MD  Admit date: 04/25/2024 Discharge date: 04/27/2024  Time spent: 60 minutes  Recommendations for Outpatient Follow-up:  Follow-up with Faustina Hood, MD in 2 weeks.  On follow-up patient will need a basic metabolic profile done to follow-up on electrolytes and renal function.  Patient's pneumonia needs to be followed up upon. Follow-up with Dr. Baldwin Levee, pulmonary in 3 weeks.   Discharge Diagnoses:  Principal Problem:   Acute respiratory failure with hypoxia (HCC) Active Problems:   HYPERCHOLESTEROLEMIA   Asthma   COPD (chronic obstructive pulmonary disease) (HCC)   BOOP (bronchiolitis obliterans with organizing pneumonia) (HCC)   GERD (gastroesophageal reflux disease)   Pneumonia of left lower lobe due to infectious organism   Community acquired pneumonia of left lower lobe of lung   Discharge Condition: Stable and improved  Diet recommendation: Regular  Filed Weights   04/25/24 1544  Weight: 69.5 kg    History of present illness:  HPI per Dr. Marquette Sites is a 83 y.o. female with medical history significant of hyperlipidemia, Asthma,/COPD, Boop, influenza-like illness, GERD, viral warts who presented to the emergency department complaints of dyspnea, cough, wheezing and congestion.  The patient stated the last week she had a upper respiratory infection following a social gathering on Sunday 8 days ago.  There were multiple person getting the same symptoms.  She called the pulmonary office today who asked him to come to the emergency department. He denied fever, chills or hemoptysis.  No chest pain, palpitations, diaphoresis, PND, orthopnea or pitting edema of the lower extremities.  No abdominal pain, nausea, emesis, diarrhea, constipation, melena or hematochezia.  No flank pain, dysuria, frequency or hematuria.  No polyuria, polydipsia, polyphagia or blurred  vision.    Lab work: CBC, CMP, troponin, BNP, lactic acid and coronavirus/influenza/RSV PCR test was negative.   Imaging: Portable 1 view chest radiograph showing left basilar patchy opacity, which may represent atelectasis, aspiration or pneumonia.  Medial left upper lung opacity likely corresponds to chronic appearing consolidation on prior CTA.   ED course: Initial vital signs were temperature 98.5 F, pulse 87, respiration 20, BP 158/85 mmHg O2 sat 98% on room air.  However the patient decreased to 88% while in the emergency department.  She was placed on nasal cannula oxygen at 2 LPM.  She received ceftriaxone and azithromycin .  Hospital Course:  #1 acute hypoxemic respiratory failure secondary to left lower lobe community-acquired pneumonia/history of COPD/history of BOOP status post lobectomy -Patient noted to have presented with complaints of dyspnea, cough, wheezing, congestion. - Chest x-ray done concerning for left basilar patchy opacity which may represent atelectasis, aspiration or pneumonia, medial left upper lobe lung opacity likely corresponds to chronic appearing consolidation on prior CTA. -Respiratory viral panel done was negative. -SARS coronavirus 2 by PCR negative, influenza A and B by PCR negative.  RSV by PCR negative. -Urine strep pneumococcus antigen negative. -Patient placed empirically on IV antibiotic of azithromycin  and Rocephin and subsequently transition to oral Ceftin, oral azithromycin , prednisone . -Patient followed by PCCM/pulmonary during the hospitalization. -Patient remained afebrile, had no significant leukocytosis and pulmonary/PCCM recommended completion of a 5-day course of azithromycin , 7 days of cephalosporin and tomorrow. - Patient noted to desat to 88% on room air requiring home O2 which improved and patient was back to room air by day of discharge with sats of 95% on room air. - Patient improved clinically and be discharged  in stable and improved  condition. - Outpatient follow-up with primary pulmonologist in PCP.  2.  Hyperlipidemia -Patient maintained on home regimen statin.  3.  GERD -Patient maintained on PPI.  4.  Hypothyroidism -Patient maintained on home regimen Synthroid.  Procedures:   Consultations: PCCM/pulmonary: Dr.Olalere 04/25/2024 Chest x-ray 04/25/2024   Discharge Exam: Vitals:   04/27/24 0739 04/27/24 1224  BP:  121/72  Pulse:  77  Resp:  16  Temp:  98.6 F (37 C)  SpO2: 93% 95%    General: NAD Cardiovascular: RRR no murmurs rubs or gallops.  No JVD.  No pitting lower extremity edema. Respiratory: Clear to auscultation bilaterally.  No wheezes, no crackles, no rhonchi.  Fair air movement.  Speaking in full sentences.  Discharge Instructions   Discharge Instructions     Diet general   Complete by: As directed    Increase activity slowly   Complete by: As directed       Allergies as of 04/27/2024       Reactions   Dorzolamide Hcl-timolol Mal Other (See Comments), Shortness Of Breath   Percocet [oxycodone-acetaminophen ] Shortness Of Breath, Other (See Comments)   Rapid heart rate and dropped the blood pressure, also   Travoprost Other (See Comments)   INTOLERANT TO TRAVATAN - eyes red and burning   Brimonidine Tartrate Itching, Other (See Comments)   Red eyes   Oxycodone Other (See Comments)   Made the B/P drop   Topamax  [topiramate ] Other (See Comments)   Tingling in the hands        Medication List     STOP taking these medications    benzonatate  100 MG capsule Commonly known as: TESSALON    mometasone  50 MCG/ACT nasal spray Commonly known as: NASONEX        TAKE these medications    Afrin NoDrip Extra Moisture 0.05 % nasal spray Generic drug: oxymetazoline Place 1 spray into both nostrils 2 (two) times daily as needed for congestion.   albuterol  108 (90 Base) MCG/ACT inhaler Commonly known as: VENTOLIN  HFA Inhale 2 puffs into the lungs 3 (three) times daily  for 5 days, THEN 2 puffs every 4 (four) hours as needed for shortness of breath or wheezing.  . Start taking on: April 27, 2024 What changed: See the new instructions.   aspirin 81 MG chewable tablet Chew 81 mg by mouth at bedtime.   atorvastatin 20 MG tablet Commonly known as: LIPITOR Take 20 mg by mouth at bedtime.   azithromycin  250 MG tablet Commonly known as: ZITHROMAX  Take 1 tablet (250 mg total) by mouth daily for 2 days. Start taking on: April 28, 2024   CALCIUM + D3 PO Take 1 tablet by mouth daily.   cefUROXime 500 MG tablet Commonly known as: CEFTIN Take 1 tablet (500 mg total) by mouth 2 (two) times daily with a meal for 5 days.   dorzolamide 2 % ophthalmic solution Commonly known as: TRUSOPT Place 1 drop into both eyes in the morning and at bedtime.   levothyroxine 50 MCG tablet Commonly known as: SYNTHROID Take 25-50 mcg by mouth See admin instructions. Take 50 mcg by mouth 30 minutes before breakfast on Sun/Mon/Wed/Fri/Sat and 25 mcg on Tues/Thurs   ondansetron  4 MG tablet Commonly known as: ZOFRAN  Take 1 tablet (4 mg total) by mouth every 6 (six) hours as needed for nausea.   optichamber diamond Misc See admin instructions.   pantoprazole 40 MG tablet Commonly known as: PROTONIX Take 40 mg by mouth daily  before breakfast.   predniSONE  20 MG tablet Commonly known as: DELTASONE  Take 1 tablet (20 mg total) by mouth daily with breakfast for 4 days. Start taking on: April 29, 2024   Qulipta  10 MG Tabs Generic drug: Atogepant  Take 10 mg by mouth daily. What changed: when to take this   rOPINIRole 0.5 MG tablet Commonly known as: REQUIP Take 0.5 mg by mouth at bedtime.   Symbicort 160-4.5 MCG/ACT inhaler Generic drug: budesonide-formoterol Inhale 2 puffs into the lungs 2 (two) times daily.   topiramate  25 MG tablet Commonly known as: TOPAMAX  Take 1 tablet (25 mg total) by mouth at bedtime.   traZODone 100 MG tablet Commonly known as: DESYREL Take  100 mg by mouth at bedtime.   TYLENOL  500 MG tablet Generic drug: acetaminophen  Take 500-1,000 mg by mouth every 6 (six) hours as needed for mild pain (pain score 1-3) (or headaches).   VITAMIN D-3 PO Take 1 capsule by mouth daily.       Allergies  Allergen Reactions   Dorzolamide Hcl-Timolol Mal Other (See Comments) and Shortness Of Breath   Percocet [Oxycodone-Acetaminophen ] Shortness Of Breath and Other (See Comments)    Rapid heart rate and dropped the blood pressure, also   Travoprost Other (See Comments)    INTOLERANT TO TRAVATAN - eyes red and burning   Brimonidine Tartrate Itching and Other (See Comments)    Red eyes   Oxycodone Other (See Comments)    Made the B/P drop   Topamax  [Topiramate ] Other (See Comments)    Tingling in the hands    Follow-up Information     Faustina Hood, MD. Schedule an appointment as soon as possible for a visit in 2 week(s).   Specialty: Family Medicine Contact information: [phone] [address] [place] [address] [phone]         Denson Flake, MD. Schedule an appointment as soon as possible for a visit in 3 week(s).   Specialty: Pulmonary Disease Contact information: [address] [place] [address] [phone]                  The results of significant diagnostics from this hospitalization (including imaging, microbiology, ancillary and laboratory) are listed below for reference.    Significant Diagnostic Studies: DG Chest Portable 1 View Result Date: 04/25/2024 CLINICAL DATA:  Shortness of breath EXAM: PORTABLE CHEST 1 VIEW COMPARISON:  Chest radiograph dated 07/01/2023, CTA chest dated 07/01/2023 FINDINGS: Normal lung volumes. Medial left upper lung opacity likely corresponds to chronic-appearing consolidation on on prior CTA. Left basilar patchy opacity. No pleural effusion or pneumothorax. Similar mildly enlarged cardiomediastinal silhouette. No acute osseous abnormality.  IMPRESSION: 1. Left basilar patchy opacity, which may represent atelectasis, aspiration, or pneumonia. 2. Medial left upper lung opacity likely corresponds to chronic-appearing consolidation on prior CTA. Electronically Signed   By: Limin  Xu M.D.   On: 04/25/2024 12:11   MM 3D SCREENING MAMMOGRAM BILATERAL BREAST Result Date: 04/07/2024 CLINICAL DATA:  Screening. EXAM: DIGITAL SCREENING BILATERAL MAMMOGRAM WITH TOMOSYNTHESIS AND CAD TECHNIQUE: Bilateral screening digital craniocaudal and mediolateral oblique mammograms were obtained. Bilateral screening digital breast tomosynthesis was performed. The images were evaluated with computer-aided detection. COMPARISON:  Previous exam(s). ACR Breast Density Category b: There are scattered areas of fibroglandular density. FINDINGS: There are no findings suspicious for malignancy. IMPRESSION: No mammographic evidence of malignancy. A result letter of this screening mammogram will be mailed directly to the patient. RECOMMENDATION: Screening mammogram in one  year. (Code:[id number]) BI-RADS CATEGORY  1: Negative. Electronically Signed   By: Anna Barnes M.D.   On: 04/07/2024 14:51    Microbiology: Recent Results (from the past 240 hours)  Blood culture (routine x 2)     Status: None (Preliminary result)   Collection Time: 04/25/24 12:15 PM   Specimen: BLOOD  Result Value Ref Range Status   Specimen Description   Final    BLOOD RIGHT ANTECUBITAL Performed at [facility], [address] [place], [place] [address]    Special Requests   Final    Blood Culture adequate volume Performed at [facility], [address] [place], [place] [address]    Culture   Final    NO GROWTH 2 DAYS Performed at [facility], [address] [place], [place] [address]    Report Status PENDING  Incomplete  Resp panel by RT-PCR (RSV, Flu A&B, Covid) Anterior Nasal Swab     Status: None   Collection Time: 04/25/24 12:15 PM    Specimen: Anterior Nasal Swab  Result Value Ref Range Status   SARS Coronavirus 2 by RT PCR NEGATIVE NEGATIVE Final    Comment: (NOTE) SARS-CoV-2 target nucleic acids are NOT DETECTED.  The SARS-CoV-2 RNA is generally detectable in upper respiratory specimens during the acute phase of infection. The lowest concentration of SARS-CoV-2 viral copies this assay can detect is 138 copies/mL. A negative result does not preclude SARS-Cov-2 infection and should not be used as the sole basis for treatment or other patient management decisions. A negative result may occur with  improper specimen collection/handling, submission of specimen other than nasopharyngeal swab, presence of viral mutation(s) within the areas targeted by this assay, and inadequate number of viral copies(<138 copies/mL). A negative result must be combined with clinical observations, patient history, and epidemiological information. The expected result is Negative.  Fact Sheet for Patients:  BloggerCourse.com  Fact Sheet for Healthcare Providers:  SeriousBroker.it  This test is no t yet approved or cleared by the [place]  FDA and  has been authorized for detection and/or diagnosis of SARS-CoV-2 by FDA under an Emergency Use Authorization (EUA). This EUA will remain  in effect (meaning this test can be used) for the duration of the COVID-19 declaration under Section 564(b)(1) of the Act, 21 U.S.C.section 360bbb-3(b)(1), unless the authorization is terminated  or revoked sooner.       Influenza A by PCR NEGATIVE NEGATIVE Final   Influenza B by PCR NEGATIVE NEGATIVE Final    Comment: (NOTE) The Xpert Xpress SARS-CoV-2/FLU/RSV plus assay is intended as an aid in the diagnosis of influenza from Nasopharyngeal swab specimens and should not be used as a sole basis for treatment. Nasal washings and aspirates are unacceptable for Xpert Xpress  SARS-CoV-2/FLU/RSV testing.  Fact Sheet for Patients: BloggerCourse.com  Fact Sheet for Healthcare Providers: SeriousBroker.it  This test is not yet approved or cleared by the [place]  FDA and has been authorized for detection and/or diagnosis of SARS-CoV-2 by FDA under an Emergency Use Authorization (EUA). This EUA will remain in effect (meaning this test can be used) for the duration of the COVID-19 declaration under Section 564(b)(1) of the Act, 21 U.S.C. section 360bbb-3(b)(1), unless the authorization is terminated or revoked.     Resp Syncytial Virus by PCR NEGATIVE NEGATIVE Final    Comment: (NOTE) Fact Sheet for Patients: BloggerCourse.com  Fact Sheet for Healthcare Providers: SeriousBroker.it  This test is not yet approved or cleared by the [place]   FDA and has been authorized for detection and/or diagnosis of SARS-CoV-2 by FDA under an Emergency Use Authorization (EUA). This EUA will remain in effect (meaning this test can be used) for the duration of the COVID-19 declaration under Section 564(b)(1) of the Act, 21 U.S.C. section 360bbb-3(b)(1), unless the authorization is terminated or revoked.  Performed at [facility], [address] [place], [place] [address]   Blood culture (routine x 2)     Status: None (Preliminary result)   Collection Time: 04/25/24  2:05 PM   Specimen: BLOOD  Result Value Ref Range Status   Specimen Description   Final    BLOOD LEFT ANTECUBITAL Performed at [facility], [address], [place], [place] [address]    Special Requests   Final    BOTTLES DRAWN AEROBIC AND ANAEROBIC Blood Culture adequate volume Performed at [facility], [address] [place], [place] [address]    Culture   Final    NO GROWTH 2 DAYS Performed at [facility], [address], [place], [place] [address]    Report Status PENDING  Incomplete  Respiratory (~20 pathogens) panel by PCR     Status: None   Collection Time: 04/25/24  6:24 PM   Specimen: Nasopharyngeal Swab; Respiratory  Result Value Ref Range Status   Adenovirus NOT DETECTED NOT DETECTED Final   Coronavirus 229E NOT DETECTED NOT DETECTED Final    Comment: (NOTE) The Coronavirus on the Respiratory Panel, DOES NOT test for the novel  Coronavirus (2019 nCoV)    Coronavirus HKU1 NOT DETECTED NOT DETECTED Final   Coronavirus NL63 NOT DETECTED NOT DETECTED Final   Coronavirus OC43 NOT DETECTED NOT DETECTED Final   Metapneumovirus NOT DETECTED NOT DETECTED Final   Rhinovirus / Enterovirus NOT DETECTED NOT DETECTED Final   Influenza A NOT DETECTED NOT DETECTED Final   Influenza B NOT DETECTED NOT DETECTED Final   Parainfluenza Virus 1 NOT DETECTED NOT DETECTED Final   Parainfluenza Virus 2 NOT DETECTED NOT DETECTED Final   Parainfluenza Virus 3 NOT DETECTED NOT DETECTED Final   Parainfluenza Virus 4 NOT DETECTED NOT DETECTED Final   Respiratory Syncytial Virus NOT DETECTED NOT DETECTED Final   Bordetella pertussis NOT DETECTED NOT DETECTED Final   Bordetella Parapertussis NOT DETECTED NOT DETECTED Final   Chlamydophila pneumoniae NOT DETECTED NOT DETECTED Final   Mycoplasma pneumoniae NOT DETECTED NOT DETECTED Final    Comment: Performed at [facility], [address], [place], [place] [address]     Labs: Basic Metabolic Panel: Recent Labs  Lab 04/25/24 1200 04/26/24 0534  NA 140 137  K 4.4 4.3  CL 103 102  CO2 25 25  GLUCOSE 95 133*  BUN 11 15  CREATININE 0.88 0.73  CALCIUM 9.3 8.7*   Liver Function Tests: Recent Labs  Lab 04/25/24 1200 04/26/24 0534  AST 23 17  ALT 12 12  ALKPHOS 61 48  BILITOT 0.7 0.8  PROT 6.9 6.5  ALBUMIN 4.3 3.9   No results for input(s): LIPASE, AMYLASE in the last 168 hours. No results for input(s): AMMONIA in the last 168  hours. CBC: Recent Labs  Lab 04/25/24 1200 04/26/24 0534  WBC 5.5 5.0  NEUTROABS 3.5  --   HGB 13.5 12.9  HCT 40.8 40.2  MCV 91.7 93.9  PLT 195 207   Cardiac Enzymes: No results for input(s): CKTOTAL, CKMB, CKMBINDEX, TROPONINI in the last 168 hours. BNP: BNP (last 3 results) No results  for input(s): BNP in the last 8760 hours.  ProBNP (last 3 results) Recent Labs    04/25/24 1200  PROBNP 89.1    CBG: No results for input(s): GLUCAP in the last 168 hours.     Signed:  Hilda Lovings MD.  [facility] 04/27/2024, 3:40 PM

## 2024-04-27 NOTE — Plan of Care (Signed)

## 2024-04-27 NOTE — TOC Transition Note (Signed)
 Transition of Care [facility]) - Discharge Note   Patient Details  Name: [name] MRN: [id number] Date of Birth: 1941-01-25  Transition of [facility] CM/SW Contact:  Ruben Corolla, RN Phone Number: 04/27/2024, 2:07 PM   Clinical Narrative:  d/c home no CM needs.     Final next level of care: Home/Self Care Barriers to Discharge: No Barriers Identified   Patient Goals and CMS Choice Patient states their goals for this hospitalization and ongoing recovery are:: Home CMS Medicare.gov Compare Post Acute Care list provided to:: Patient Represenative (must comment) ([name]) Choice offered to / list presented to : Adult Children  ownership interest in [facility].provided to:: Adult Children    Discharge Placement                       Discharge Plan and Services Additional resources added to the After Visit Summary for     Discharge Planning Services: CM Consult Post Acute Care Choice: Resumption of Svcs/PTA Provider                               Social Drivers of Health (SDOH) Interventions SDOH Screenings   Food Insecurity: No Food Insecurity (04/25/2024)  Housing: Low Risk  (04/25/2024)  Transportation Needs: No Transportation Needs (04/25/2024)  Utilities: Not At Risk (04/25/2024)  Social Connections: Unknown (04/25/2024)  Tobacco Use: Medium Risk (04/25/2024)     Readmission Risk Interventions     No data to display

## 2024-04-27 NOTE — Progress Notes (Signed)
   NAME:  [name], MRN:  [id number], DOB:  1941/02/15, LOS: 2 ADMISSION DATE:  04/25/2024, CONSULTATION DATE: 6/16 REFERRING MD: Dr. Bonita Bussing, CHIEF COMPLAINT: Acute respiratory failure with hypoxia COVID-19 positive test (U07.1, COVID-19) with Acute Pneumonia (J12.89, Other viral pneumonia) (If respiratory failure or sepsis present, add as separate assessment)    History of Present Illness:  Patient is a 83 yo F w/ pertinent PMH COPD (seen by Dr. Baldwin Levee), BOOP, GERD, HLD presents to [facility] on 6/16 w/ acute respiratory failure w/ hypoxia 2/2 pna.   Patient has been having progressively worsening symptoms of congestion and sob over the past several days. Denies chest pain or fever. Came to [facility] ED for further eval. On arrival afebrile and wbc 5.5. Sats low as 88% in ED requiring supplemental o2. CXR w/ left basilar patchy opacity and medial LUL opacity chronic appearing. BNP 89. LA 1.4. Covid, flu, rsv negative. Cultures obtained and started on rocephin and azithromycin . Admitted by [facility]. PCCM consulted.  Pertinent  Medical History   Past Medical History:  Diagnosis Date   Other acute sinusitis    Other diseases of nasal cavity and sinuses(478.19)    Pure hypercholesterolemia    Unspecified asthma(493.90)    Significant Hospital Events: Including procedures, antibiotic start and stop dates in addition to other pertinent events   6/16 admitted with pneumonia, PCCM consulted  Interim History / Subjective:  Denies pain or discomfort Generally feels about the same She is coughing, bringing up a little bit more  Objective    Blood pressure 124/67, pulse 69, temperature 97.7 F (36.5 C), temperature source Oral, resp. rate 16, height 5' 6.5 (1.689 m), weight 69.5 kg, SpO2 93%.        Intake/Output Summary (Last 24 hours) at 04/27/2024 1054 Last data filed at 04/27/2024 0500 Gross per 24 hour  Intake 120 ml  Output --  Net 120 ml   Filed Weights   04/25/24 1544  Weight: 69.5 kg     Examination: General: Elderly, does not appear to be in distress  HENT: Moist oral mucosa Lungs: Decreased air movement at the bases bilaterally Cardiovascular: S1-S2 appreciated  Resolved problem list   Assessment and Plan   Left lower lobe pneumonia History of chronic obstructive pulmonary disease  Past history of Boop status post lobectomy in 2007  Transition to oral antibiotics 6/17  No fevers No significant leukocytosis Suggest to complete course of antibiotic therapy and steroids-complete 5 days of azithromycin , 7 days of cephalosporin in total  Can be discharged from a pulmonary perspective  Myer Artis, MD [place] P[facility]M Pager: See [place] Foley

## 2024-04-28 ENCOUNTER — Ambulatory Visit: Admitting: Emergency Medicine

## 2024-04-28 ENCOUNTER — Ambulatory Visit: Admitting: Podiatry

## 2024-04-28 ENCOUNTER — Telehealth: Payer: Self-pay | Admitting: Emergency Medicine

## 2024-04-28 NOTE — Telephone Encounter (Signed)
 See last tel encounter. PT ended up going to the hospital after this. Can I use the hospital FU appt Dr. B has open on Aug 6th at 10:30? Otherwise she can't be seen until Sept. Please let me know here at the front desk. Thanks.

## 2024-04-28 NOTE — Telephone Encounter (Signed)
 Yes, please schedule.

## 2024-04-29 ENCOUNTER — Ambulatory Visit: Admitting: Podiatry

## 2024-04-30 LAB — CULTURE, BLOOD (ROUTINE X 2)
Culture: NO GROWTH
Culture: NO GROWTH
Special Requests: ADEQUATE
Special Requests: ADEQUATE

## 2024-05-05 DIAGNOSIS — J449 Chronic obstructive pulmonary disease, unspecified: Secondary | ICD-10-CM | POA: Diagnosis not present

## 2024-05-05 DIAGNOSIS — J8489 Other specified interstitial pulmonary diseases: Secondary | ICD-10-CM | POA: Diagnosis not present

## 2024-05-05 DIAGNOSIS — J189 Pneumonia, unspecified organism: Secondary | ICD-10-CM | POA: Diagnosis not present

## 2024-05-05 DIAGNOSIS — Z09 Encounter for follow-up examination after completed treatment for conditions other than malignant neoplasm: Secondary | ICD-10-CM | POA: Diagnosis not present

## 2024-05-05 DIAGNOSIS — J9601 Acute respiratory failure with hypoxia: Secondary | ICD-10-CM | POA: Diagnosis not present

## 2024-05-05 DIAGNOSIS — E039 Hypothyroidism, unspecified: Secondary | ICD-10-CM | POA: Diagnosis not present

## 2024-05-31 ENCOUNTER — Ambulatory Visit: Admitting: Podiatry

## 2024-06-09 ENCOUNTER — Encounter: Payer: Self-pay | Admitting: Podiatry

## 2024-06-09 ENCOUNTER — Ambulatory Visit: Admitting: Podiatry

## 2024-06-09 DIAGNOSIS — B351 Tinea unguium: Secondary | ICD-10-CM | POA: Diagnosis not present

## 2024-06-09 DIAGNOSIS — M79676 Pain in unspecified toe(s): Secondary | ICD-10-CM | POA: Diagnosis not present

## 2024-06-09 NOTE — Progress Notes (Signed)
This patient returns to the office for evaluation and treatment of long thick painful nails .  This patient is unable to trim her own nails since the patient cannot reach her feet.  Patient says the nails are painful walking and wearing his shoes.  She returns for preventive foot care services.  General Appearance  Alert, conversant and in no acute stress.  Vascular  Dorsalis pedis and posterior tibial  pulses are  weakly palpable  bilaterally.  Capillary return is within normal limits  bilaterally. Temperature is within normal limits  bilaterally.  Neurologic  Senn-Weinstein monofilament wire test within normal limits  bilaterally. Muscle power within normal limits bilaterally.  Nails Thick disfigured discolored nails with subungual debris  from hallux to third  toes bilaterally. No evidence of bacterial infection or drainage bilaterally.  Orthopedic  No limitations of motion  feet .  No crepitus or effusions noted.  No bony pathology or digital deformities noted.  Skin  normotropic skin with no porokeratosis noted bilaterally.  No signs of infections or ulcers noted.     Onychomycosis  Pain in toes right foot  Pain in toes left foot  Debridement  of nails  1-5  B/L with a nail nipper.  Nails were then filed using a dremel tool with no incidents.    RTC  10 weeks    Helane Gunther DPM

## 2024-06-10 ENCOUNTER — Encounter: Payer: Self-pay | Admitting: Adult Health

## 2024-06-10 ENCOUNTER — Ambulatory Visit: Admitting: Adult Health

## 2024-06-10 VITALS — BP 127/73 | HR 74 | Ht 66.0 in | Wt 158.8 lb

## 2024-06-10 DIAGNOSIS — M810 Age-related osteoporosis without current pathological fracture: Secondary | ICD-10-CM | POA: Insufficient documentation

## 2024-06-10 DIAGNOSIS — E785 Hyperlipidemia, unspecified: Secondary | ICD-10-CM | POA: Insufficient documentation

## 2024-06-10 DIAGNOSIS — K219 Gastro-esophageal reflux disease without esophagitis: Secondary | ICD-10-CM | POA: Insufficient documentation

## 2024-06-10 DIAGNOSIS — J479 Bronchiectasis, uncomplicated: Secondary | ICD-10-CM | POA: Insufficient documentation

## 2024-06-10 DIAGNOSIS — E039 Hypothyroidism, unspecified: Secondary | ICD-10-CM | POA: Insufficient documentation

## 2024-06-10 DIAGNOSIS — J8410 Pulmonary fibrosis, unspecified: Secondary | ICD-10-CM | POA: Insufficient documentation

## 2024-06-10 DIAGNOSIS — J449 Chronic obstructive pulmonary disease, unspecified: Secondary | ICD-10-CM | POA: Insufficient documentation

## 2024-06-10 DIAGNOSIS — G43109 Migraine with aura, not intractable, without status migrainosus: Secondary | ICD-10-CM

## 2024-06-10 MED ORDER — UBRELVY 100 MG PO TABS: 100.0000 mg | ORAL_TABLET | ORAL | 11 refills | Status: AC | PRN

## 2024-06-10 MED ORDER — QULIPTA 10 MG PO TABS: 10.0000 mg | ORAL_TABLET | Freq: Every day | ORAL | 11 refills | Status: AC

## 2024-06-10 NOTE — Patient Instructions (Addendum)
 Your Plan:  Continue Qulipta  10 mg daily for migraine prevention  Continue Ubrelvy  as needed - can repeat after 2 hours if needed. Do not take more than 2 tablets in 24 hrs  Okay to use magnesium to help with constipation as this can also help with headaches Okay to also use miralax, senna or colace as needed if further assistance is needed     Follow-up in 8 months or call earlier if needed     Thank you for coming to see us  at [facility]. I hope we have been able to provide you high quality care today.  You may receive a patient satisfaction survey over the next few weeks. We would appreciate your feedback and comments so that we may continue to improve ourselves and the health of our patients.
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
## 2024-06-14 DIAGNOSIS — J479 Bronchiectasis, uncomplicated: Secondary | ICD-10-CM | POA: Diagnosis not present

## 2024-06-14 DIAGNOSIS — J8489 Other specified interstitial pulmonary diseases: Secondary | ICD-10-CM | POA: Diagnosis not present

## 2024-06-16 ENCOUNTER — Inpatient Hospital Stay: Admitting: Emergency Medicine

## 2024-06-23 DIAGNOSIS — M810 Age-related osteoporosis without current pathological fracture: Secondary | ICD-10-CM | POA: Diagnosis not present

## 2024-06-23 DIAGNOSIS — Z79899 Other long term (current) drug therapy: Secondary | ICD-10-CM | POA: Diagnosis not present

## 2024-06-23 DIAGNOSIS — J449 Chronic obstructive pulmonary disease, unspecified: Secondary | ICD-10-CM | POA: Diagnosis not present

## 2024-06-23 DIAGNOSIS — G4700 Insomnia, unspecified: Secondary | ICD-10-CM | POA: Diagnosis not present

## 2024-06-23 DIAGNOSIS — E7800 Pure hypercholesterolemia, unspecified: Secondary | ICD-10-CM | POA: Diagnosis not present

## 2024-06-23 DIAGNOSIS — J8489 Other specified interstitial pulmonary diseases: Secondary | ICD-10-CM | POA: Diagnosis not present

## 2024-06-23 DIAGNOSIS — K219 Gastro-esophageal reflux disease without esophagitis: Secondary | ICD-10-CM | POA: Diagnosis not present

## 2024-06-23 DIAGNOSIS — G2581 Restless legs syndrome: Secondary | ICD-10-CM | POA: Diagnosis not present

## 2024-06-23 DIAGNOSIS — E039 Hypothyroidism, unspecified: Secondary | ICD-10-CM | POA: Diagnosis not present

## 2024-06-23 DIAGNOSIS — E559 Vitamin D deficiency, unspecified: Secondary | ICD-10-CM | POA: Diagnosis not present

## 2024-07-06 DIAGNOSIS — H401112 Primary open-angle glaucoma, right eye, moderate stage: Secondary | ICD-10-CM | POA: Diagnosis not present

## 2024-07-06 DIAGNOSIS — H401132 Primary open-angle glaucoma, bilateral, moderate stage: Secondary | ICD-10-CM | POA: Diagnosis not present

## 2024-07-06 DIAGNOSIS — H401122 Primary open-angle glaucoma, left eye, moderate stage: Secondary | ICD-10-CM | POA: Diagnosis not present

## 2024-07-13 DIAGNOSIS — J479 Bronchiectasis, uncomplicated: Secondary | ICD-10-CM | POA: Diagnosis not present

## 2024-07-13 DIAGNOSIS — J8489 Other specified interstitial pulmonary diseases: Secondary | ICD-10-CM | POA: Diagnosis not present

## 2024-07-18 DIAGNOSIS — J479 Bronchiectasis, uncomplicated: Secondary | ICD-10-CM | POA: Diagnosis not present

## 2024-08-08 DIAGNOSIS — B349 Viral infection, unspecified: Secondary | ICD-10-CM | POA: Diagnosis not present

## 2024-08-23 DIAGNOSIS — M810 Age-related osteoporosis without current pathological fracture: Secondary | ICD-10-CM | POA: Diagnosis not present

## 2024-08-23 DIAGNOSIS — Z9989 Dependence on other enabling machines and devices: Secondary | ICD-10-CM | POA: Diagnosis not present

## 2024-08-23 DIAGNOSIS — H409 Unspecified glaucoma: Secondary | ICD-10-CM | POA: Diagnosis not present

## 2024-08-23 DIAGNOSIS — G43909 Migraine, unspecified, not intractable, without status migrainosus: Secondary | ICD-10-CM | POA: Diagnosis not present

## 2024-08-23 DIAGNOSIS — G4700 Insomnia, unspecified: Secondary | ICD-10-CM | POA: Diagnosis not present

## 2024-08-23 DIAGNOSIS — G2581 Restless legs syndrome: Secondary | ICD-10-CM | POA: Diagnosis not present

## 2024-08-23 DIAGNOSIS — E785 Hyperlipidemia, unspecified: Secondary | ICD-10-CM | POA: Diagnosis not present

## 2024-08-23 DIAGNOSIS — Z7982 Long term (current) use of aspirin: Secondary | ICD-10-CM | POA: Diagnosis not present

## 2024-08-23 DIAGNOSIS — I10 Essential (primary) hypertension: Secondary | ICD-10-CM | POA: Diagnosis not present

## 2024-08-23 DIAGNOSIS — K219 Gastro-esophageal reflux disease without esophagitis: Secondary | ICD-10-CM | POA: Diagnosis not present

## 2024-08-23 DIAGNOSIS — E012 Iodine-deficiency related (endemic) goiter, unspecified: Secondary | ICD-10-CM | POA: Diagnosis not present

## 2024-08-23 DIAGNOSIS — J479 Bronchiectasis, uncomplicated: Secondary | ICD-10-CM | POA: Diagnosis not present

## 2024-09-09 ENCOUNTER — Ambulatory Visit: Admitting: Podiatry

## 2024-09-09 ENCOUNTER — Encounter: Payer: Self-pay | Admitting: Podiatry

## 2024-09-09 DIAGNOSIS — B351 Tinea unguium: Secondary | ICD-10-CM | POA: Diagnosis not present

## 2024-09-09 DIAGNOSIS — I872 Venous insufficiency (chronic) (peripheral): Secondary | ICD-10-CM

## 2024-09-09 DIAGNOSIS — M79609 Pain in unspecified limb: Secondary | ICD-10-CM | POA: Diagnosis not present

## 2024-09-09 NOTE — Progress Notes (Signed)
This patient returns to the office for evaluation and treatment of long thick painful nails .  This patient is unable to trim her own nails since the patient cannot reach her feet.  Patient says the nails are painful walking and wearing his shoes.  She returns for preventive foot care services.  General Appearance  Alert, conversant and in no acute stress.  Vascular  Dorsalis pedis and posterior tibial  pulses are  weakly palpable  bilaterally.  Capillary return is within normal limits  bilaterally. Temperature is within normal limits  bilaterally.  Neurologic  Senn-Weinstein monofilament wire test within normal limits  bilaterally. Muscle power within normal limits bilaterally.  Nails Thick disfigured discolored nails with subungual debris  from hallux to third  toes bilaterally. No evidence of bacterial infection or drainage bilaterally.  Orthopedic  No limitations of motion  feet .  No crepitus or effusions noted.  No bony pathology or digital deformities noted.  Skin  normotropic skin with no porokeratosis noted bilaterally.  No signs of infections or ulcers noted.     Onychomycosis  Pain in toes right foot  Pain in toes left foot  Debridement  of nails  1-5  B/L with a nail nipper.  Nails were then filed using a dremel tool with no incidents.    RTC  10 weeks    Helane Gunther DPM

## 2024-09-28 DIAGNOSIS — H524 Presbyopia: Secondary | ICD-10-CM | POA: Diagnosis not present

## 2024-09-28 DIAGNOSIS — H52223 Regular astigmatism, bilateral: Secondary | ICD-10-CM | POA: Diagnosis not present

## 2024-12-09 ENCOUNTER — Ambulatory Visit: Admitting: Podiatry

## 2025-02-13 ENCOUNTER — Ambulatory Visit: Admitting: Adult Health
